# Patient Record
Sex: Female | Born: 1955 | Hispanic: No | Smoking: Never smoker
Health system: Southern US, Community
[De-identification: ages and names within clinical notes are randomized; demographics above are authoritative.]

## PROBLEM LIST (undated history)

## (undated) DIAGNOSIS — I10 Essential (primary) hypertension: Secondary | ICD-10-CM

## (undated) DIAGNOSIS — E785 Hyperlipidemia, unspecified: Secondary | ICD-10-CM

## (undated) DIAGNOSIS — I2510 Atherosclerotic heart disease of native coronary artery without angina pectoris: Secondary | ICD-10-CM

## (undated) HISTORY — DX: Hyperlipidemia, unspecified: E78.5

## (undated) HISTORY — PX: OTHER SURGICAL HISTORY: SHX169

## (undated) HISTORY — DX: Atherosclerotic heart disease of native coronary artery without angina pectoris: I25.10

## (undated) HISTORY — DX: Essential (primary) hypertension: I10

---

## 2009-04-29 ENCOUNTER — Other Ambulatory Visit: Admission: RE | Admit: 2009-04-29 | Discharge: 2009-04-29 | Payer: Self-pay | Admitting: Family Medicine

## 2015-02-07 ENCOUNTER — Other Ambulatory Visit: Payer: Self-pay | Admitting: Gastroenterology

## 2015-02-07 DIAGNOSIS — R634 Abnormal weight loss: Secondary | ICD-10-CM

## 2015-02-07 DIAGNOSIS — R1084 Generalized abdominal pain: Secondary | ICD-10-CM

## 2015-02-12 ENCOUNTER — Other Ambulatory Visit: Payer: Self-pay

## 2015-02-13 ENCOUNTER — Ambulatory Visit
Admission: RE | Admit: 2015-02-13 | Discharge: 2015-02-13 | Disposition: A | Discharge status: Home / Self Care | Payer: Self-pay | Source: Ambulatory Visit | Attending: Gastroenterology | Admitting: Gastroenterology

## 2015-02-13 ENCOUNTER — Other Ambulatory Visit: Payer: Self-pay | Admitting: Gastroenterology

## 2015-02-13 DIAGNOSIS — R1084 Generalized abdominal pain: Secondary | ICD-10-CM

## 2015-02-13 DIAGNOSIS — R109 Unspecified abdominal pain: Secondary | ICD-10-CM

## 2015-02-13 DIAGNOSIS — R634 Abnormal weight loss: Secondary | ICD-10-CM

## 2015-02-13 MED ORDER — IOPAMIDOL (ISOVUE-300) INJECTION 61%
100.0000 mL | Freq: Once | INTRAVENOUS | Status: AC | PRN
  Administered 2015-02-13: 100 mL via INTRAVENOUS

## 2015-02-15 ENCOUNTER — Other Ambulatory Visit: Payer: Self-pay

## 2016-07-06 DIAGNOSIS — Z0000 Encounter for general adult medical examination without abnormal findings: Secondary | ICD-10-CM | POA: Diagnosis not present

## 2016-07-06 DIAGNOSIS — E7800 Pure hypercholesterolemia, unspecified: Secondary | ICD-10-CM | POA: Diagnosis not present

## 2016-07-06 DIAGNOSIS — I10 Essential (primary) hypertension: Secondary | ICD-10-CM | POA: Diagnosis not present

## 2017-01-14 IMAGING — CT CT ABD-PELV W/ CM
2 of 5 series · 16 of 46 positions shown, 18 images · IV contrast (APPLIED)
Comparison: No priors.

CLINICAL DATA: 58-year-old female with 11 pound weight loss and
generalized abdominal pain for the past 3 months. Some intermittent
diarrhea.

EXAM:
CT ABDOMEN AND PELVIS WITH CONTRAST
TECHNIQUE: Multidetector CT imaging of the abdomen and pelvis was performed
using the standard protocol following bolus administration of
intravenous contrast.
CONTRAST:  100mL V9XRDQ-O22 IOPAMIDOL (V9XRDQ-O22) INJECTION 61%

[Series 2: abd pelvis 5.0 i41s 1 · axial · 0.75mm/px · z∈[-478,-68]mm · 13 of 92 slices shown, 15 images]
[im 5/92  soft-tissue]
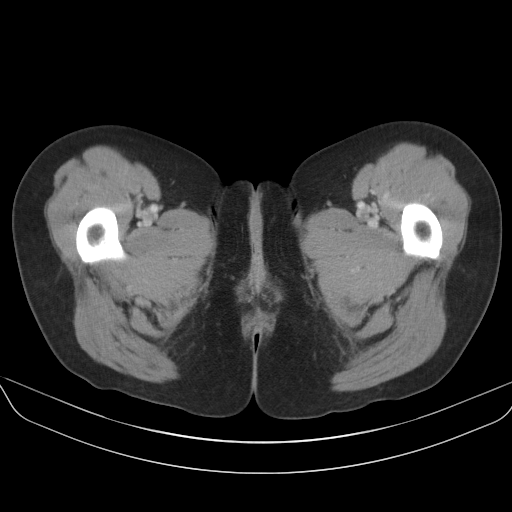
[im 5/92  bone]
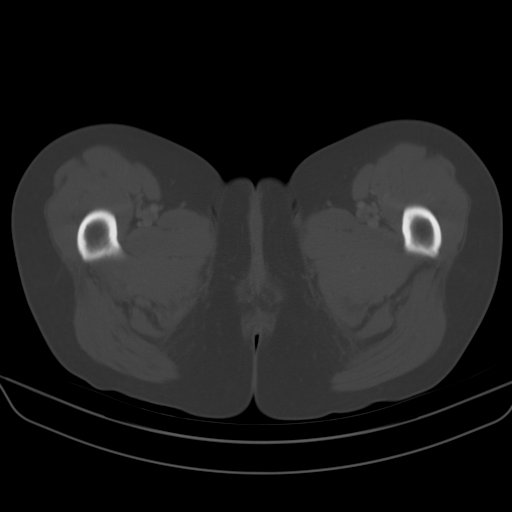
[im 14/92  soft-tissue]
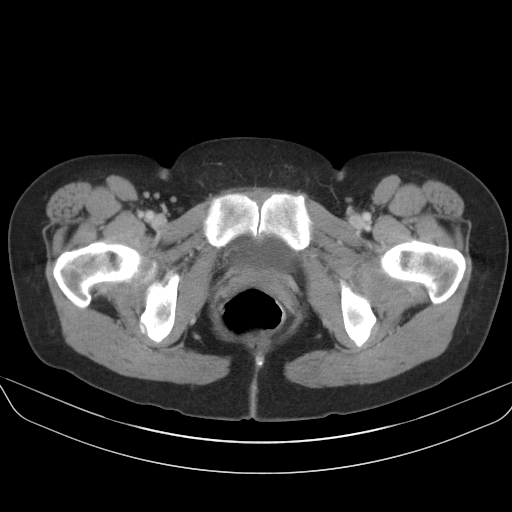
[im 19/92  soft-tissue]
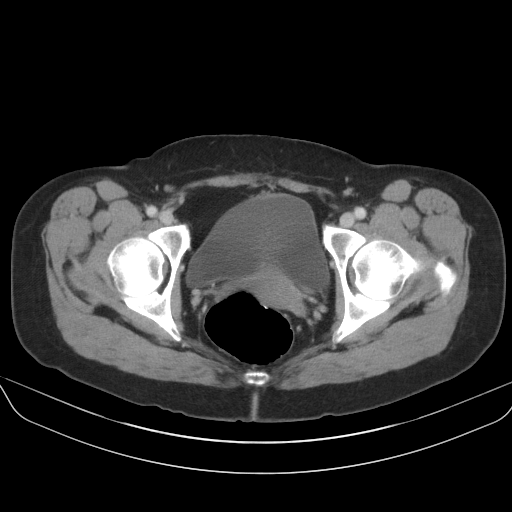
[im 28/92  soft-tissue]
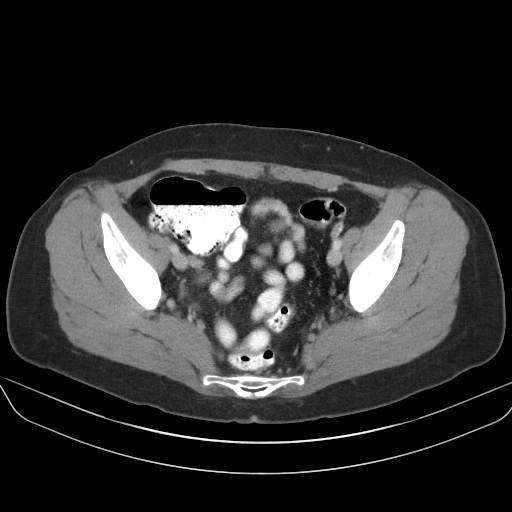
[im 32/92  soft-tissue]
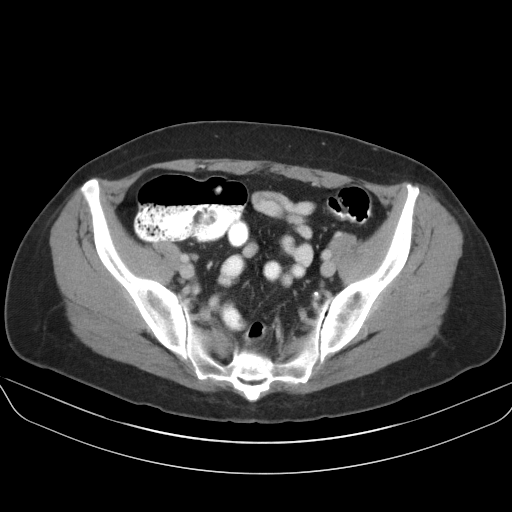
[im 41/92  soft-tissue]
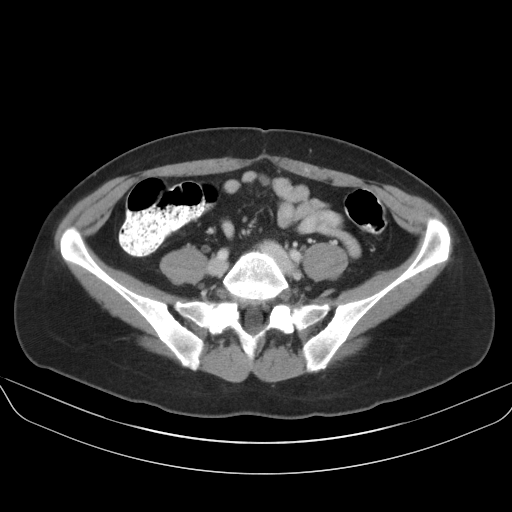
[im 46/92  soft-tissue]
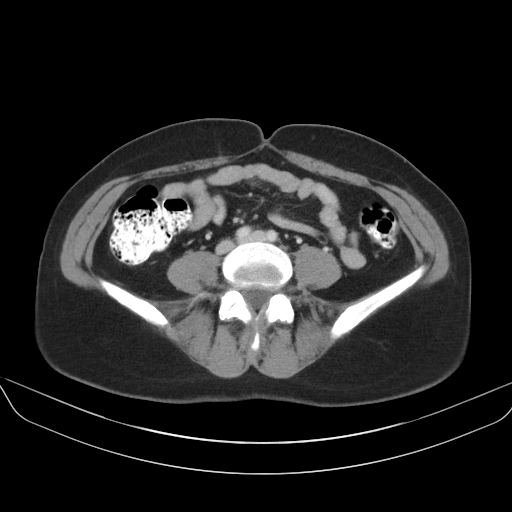
[im 51/92  soft-tissue]
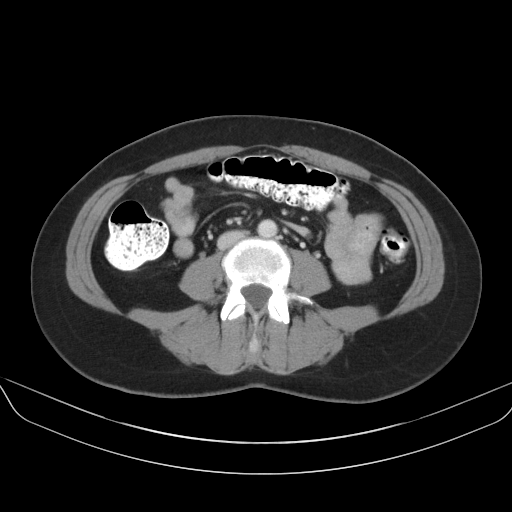
[im 60/92  soft-tissue]
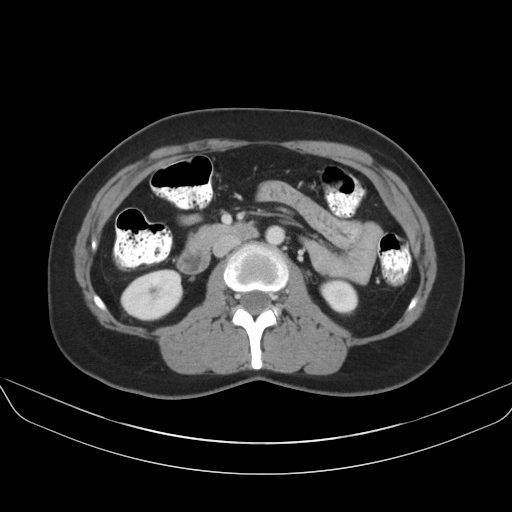
[im 60/92  bone]
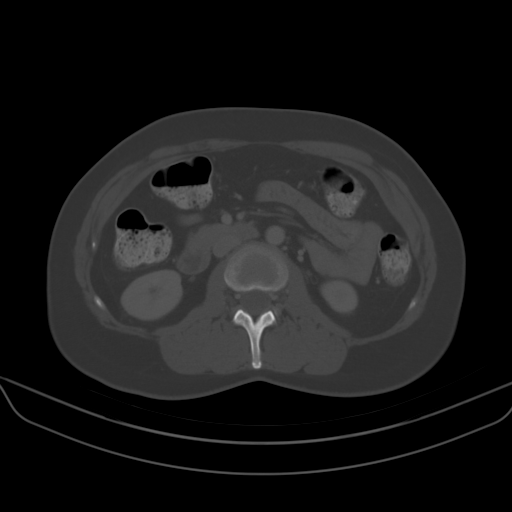
[im 64/92  soft-tissue]
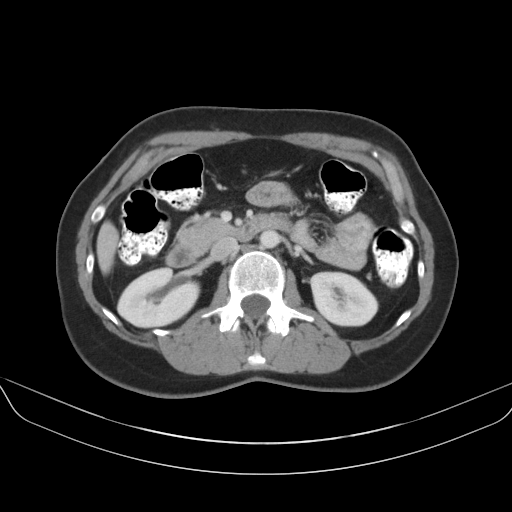
[im 73/92  soft-tissue]
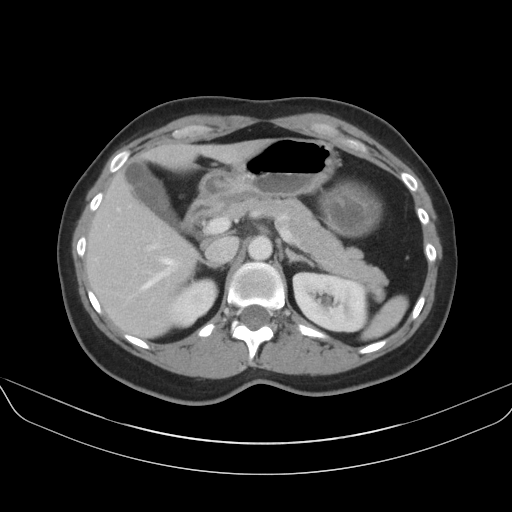
[im 78/92  soft-tissue]
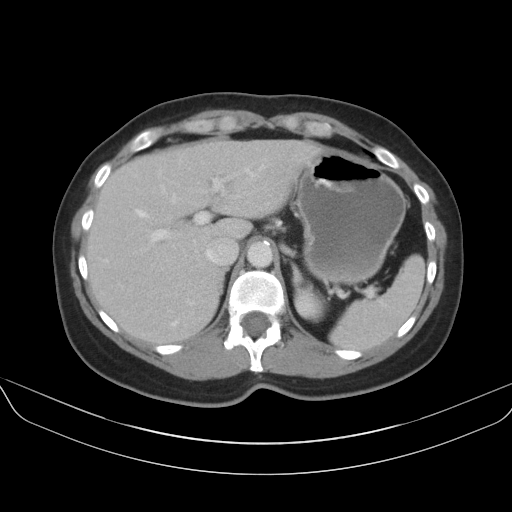
[im 87/92  soft-tissue]
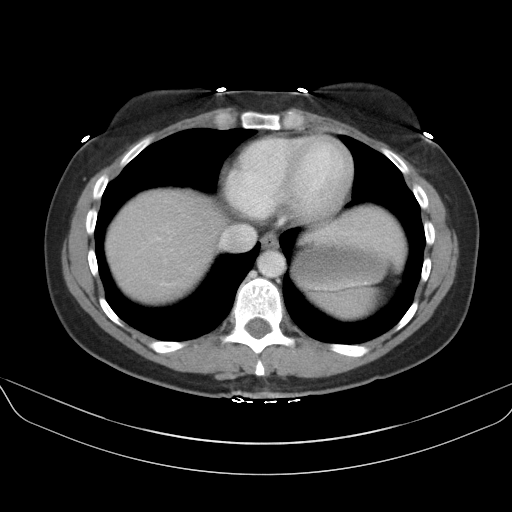

[Series 3: abd pelvis 3.0 spo cor · coronal · 0.75mm/px · 3 of 72 slices shown]
[im 24/72  soft-tissue]
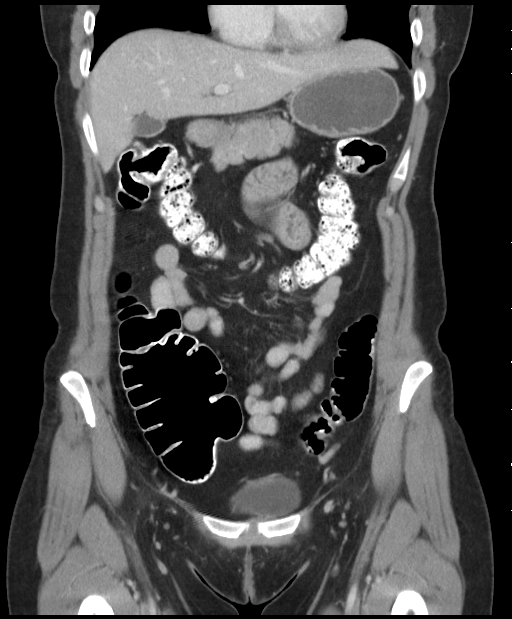
[im 32/72  soft-tissue]
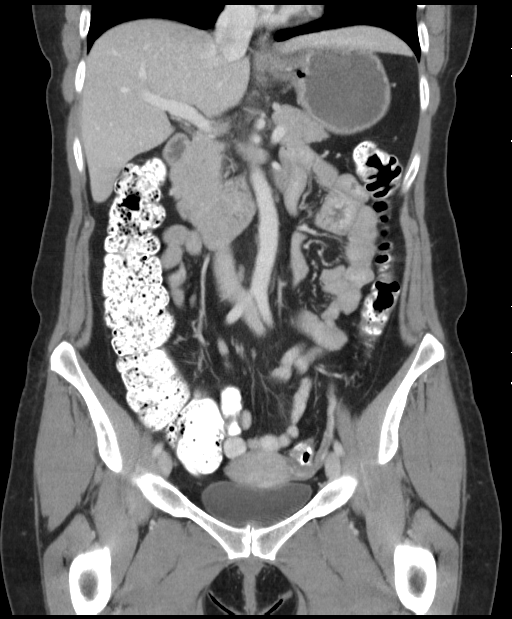
[im 40/72  soft-tissue]
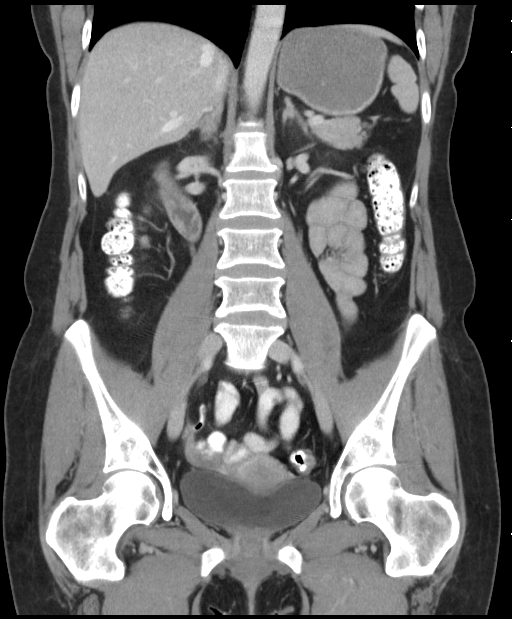

[16 of 46 positions shown; findings below may reference images not displayed]

FINDINGS: Lower chest:  Unremarkable.

Hepatobiliary: Sub cm low-attenuation lesion in segment 4A of the
liver is too small to definitively characterize, but statistically
likely a tiny cyst. No larger more suspicious appearing cystic or
solid hepatic lesions are identified. No intra or extrahepatic
biliary ductal dilatation. Gallbladder is normal in appearance.

Pancreas: No pancreatic mass. No pancreatic ductal dilatation. No
pancreatic or peripancreatic fluid or inflammatory changes.

Spleen: Unremarkable.

Adrenals/Urinary Tract: Bilateral adrenal glands and bilateral
kidneys are normal in appearance. No hydroureteronephrosis. Urinary
bladder is normal in appearance.

Stomach/Bowel: Normal appearance of the stomach. No pathologic
dilatation of small bowel or colon. Normal appendix.

Vascular/Lymphatic: Mild atherosclerosis in the abdominal and pelvic
vasculature, without evidence of aneurysm or dissection. No
lymphadenopathy noted in the abdomen or pelvis.

Reproductive: Uterus and ovaries are unremarkable in appearance.

Other: No significant volume of ascites.  No pneumoperitoneum.

Musculoskeletal: There are no aggressive appearing lytic or blastic
lesions noted in the visualized portions of the skeleton.
IMPRESSION: 1. No acute findings in the abdomen or pelvis to account for the
patient's symptoms.
2. Normal appendix.
3. Additional incidental findings, as above.

## 2017-07-13 DIAGNOSIS — E7800 Pure hypercholesterolemia, unspecified: Secondary | ICD-10-CM | POA: Diagnosis not present

## 2017-07-13 DIAGNOSIS — Z0000 Encounter for general adult medical examination without abnormal findings: Secondary | ICD-10-CM | POA: Diagnosis not present

## 2017-07-13 DIAGNOSIS — I10 Essential (primary) hypertension: Secondary | ICD-10-CM | POA: Diagnosis not present

## 2017-07-13 DIAGNOSIS — G4700 Insomnia, unspecified: Secondary | ICD-10-CM | POA: Diagnosis not present

## 2017-08-09 DIAGNOSIS — Z1211 Encounter for screening for malignant neoplasm of colon: Secondary | ICD-10-CM | POA: Diagnosis not present

## 2017-08-26 DIAGNOSIS — Z1211 Encounter for screening for malignant neoplasm of colon: Secondary | ICD-10-CM | POA: Diagnosis not present

## 2018-03-17 DIAGNOSIS — Z01419 Encounter for gynecological examination (general) (routine) without abnormal findings: Secondary | ICD-10-CM | POA: Diagnosis not present

## 2018-03-17 DIAGNOSIS — Z1231 Encounter for screening mammogram for malignant neoplasm of breast: Secondary | ICD-10-CM | POA: Diagnosis not present

## 2018-03-17 DIAGNOSIS — Z6827 Body mass index (BMI) 27.0-27.9, adult: Secondary | ICD-10-CM | POA: Diagnosis not present

## 2018-03-17 DIAGNOSIS — Z124 Encounter for screening for malignant neoplasm of cervix: Secondary | ICD-10-CM | POA: Diagnosis not present

## 2018-08-02 DIAGNOSIS — Z0000 Encounter for general adult medical examination without abnormal findings: Secondary | ICD-10-CM | POA: Diagnosis not present

## 2018-08-02 DIAGNOSIS — Z1159 Encounter for screening for other viral diseases: Secondary | ICD-10-CM | POA: Diagnosis not present

## 2018-08-02 DIAGNOSIS — I10 Essential (primary) hypertension: Secondary | ICD-10-CM | POA: Diagnosis not present

## 2019-04-11 DIAGNOSIS — R002 Palpitations: Secondary | ICD-10-CM | POA: Diagnosis not present

## 2019-04-11 DIAGNOSIS — F411 Generalized anxiety disorder: Secondary | ICD-10-CM | POA: Diagnosis not present

## 2019-08-08 DIAGNOSIS — I10 Essential (primary) hypertension: Secondary | ICD-10-CM | POA: Diagnosis not present

## 2019-08-08 DIAGNOSIS — Z0000 Encounter for general adult medical examination without abnormal findings: Secondary | ICD-10-CM | POA: Diagnosis not present

## 2019-08-08 DIAGNOSIS — E7800 Pure hypercholesterolemia, unspecified: Secondary | ICD-10-CM | POA: Diagnosis not present

## 2019-08-08 DIAGNOSIS — R002 Palpitations: Secondary | ICD-10-CM | POA: Diagnosis not present

## 2019-08-22 ENCOUNTER — Ambulatory Visit: Payer: BC Managed Care – PPO

## 2019-08-22 ENCOUNTER — Encounter: Payer: Self-pay | Admitting: Cardiology

## 2019-08-22 ENCOUNTER — Other Ambulatory Visit: Payer: Self-pay

## 2019-08-22 ENCOUNTER — Ambulatory Visit: Payer: BC Managed Care – PPO | Admitting: Cardiology

## 2019-08-22 VITALS — BP 140/86 | HR 82 | Ht 63.0 in | Wt 132.3 lb

## 2019-08-22 DIAGNOSIS — R072 Precordial pain: Secondary | ICD-10-CM | POA: Diagnosis not present

## 2019-08-22 DIAGNOSIS — I10 Essential (primary) hypertension: Secondary | ICD-10-CM

## 2019-08-22 DIAGNOSIS — R002 Palpitations: Secondary | ICD-10-CM

## 2019-08-22 DIAGNOSIS — R0600 Dyspnea, unspecified: Secondary | ICD-10-CM | POA: Diagnosis not present

## 2019-08-22 DIAGNOSIS — R0609 Other forms of dyspnea: Secondary | ICD-10-CM

## 2019-08-22 MED ORDER — AMLODIPINE BESYLATE 5 MG PO TABS: 5.0000 mg | ORAL_TABLET | Freq: Every evening | ORAL | 3 refills | Status: DC

## 2019-08-22 NOTE — Patient Instructions (Addendum)
Please remember to bring in your medication bottles in at the next visit.   New Medications that were added at today's visit:  Norvasc 5 mg p.o. q. evening  Medications that were discontinued at today's visit: None  Office will call you to have the following tests scheduled:  Echo  CT calcium score Monitor  Please get blood work done on September 19, 2019 at the nearest [facility] prior to the next office visit.  Recommend follow up with your PCP as scheduled.

## 2019-08-22 NOTE — Progress Notes (Signed)
REASON FOR CONSULT: Palpitations, shortness of breath, family history of cardiac disease.  Chief Complaint  Patient presents with  . Palpitations   REQUESTING PHYSICIAN:  Rankins, Bill Salinas, MD [address],  [place] [address]  HPI  Jane Cuevas is a 64 y.o. female who presents to the office with a chief complaint of " palpitations and shortness of breath." Patient's past medical history and cardiac risk factors include: Hypertension, hyperlipidemia, postmenopausal female.  Palpitations: Patient states that she has been experiencing palpitation for approximately 1 year.  They occur every night and the symptoms are better in the morning.  Symptoms can occur while she is at rest or with effort related activities.  Over the last 2 or 3 weeks the frequency of her palpitations have decreased compared to before.  She states that in the past that she was using valerian supplements to see if the symptoms would improve and was also taking meldonium which is considered to be a performance enhancing medication.  Both of these supplements has been getting from either friends or sisters from [place].  No improving or worsening factors, the symptoms usually last for about 15 minutes in duration.  Patient denies any associated symptoms of near syncope or syncope.  Chest heaviness: When going for the review of systems patient notes that she is also been having chest heaviness at times.  They occur about 3 times a week.  More common in the evening hours.  The symptoms last for less than 60 minutes and the intensity is 5 out of 10.  The heaviness is located over the left breast area and it is not associated with effort related activities noted that improved with rest.  Patient states that it is usually self-limited.  No recent cardiovascular work-up.  Patient does have elevated cholesterol for which she is currently not on medications because she would like to try lifestyle modifications.   Currently she denies any active chest pain during the office visit.  Benign essential hypertension: Blood pressure at today's office visit is not at goal.  Patient states that when she checks her blood pressure at home it ranges between 917-915 mmHg and diastolic blood pressures range between 70-75 mmHg.  Patient states that she tries to consume a low-salt diet.  And she was never been told that the numbers that she is getting at home are uncontrolled.  Review of systems positive for: Palpitations, chest heaviness (as described above), occasional effort related dyspnea. Currently patient denies lightheadedness, dizziness, orthopnea, paroxysmal nocturnal dyspnea, lower extremity swelling, near syncope, syncopal events, hematochezia, hemoptysis, hematemesis, melanotic stools, no symptoms of amaurosis fugax, motor or sensory symptoms or dysphasia in the last 6 months.   Denies prior history of coronary artery disease, myocardial infarction, congestive heart failure, deep venous thrombosis, pulmonary embolism, stroke, transient ischemic attack.  ALLERGIES: No Known Allergies   MEDICATION LIST PRIOR TO VISIT: Current Outpatient Medications on File Prior to Visit  Medication Sig Dispense Refill  . valsartan (DIOVAN) 160 MG tablet Take 160 mg by mouth daily.    . metoprolol succinate (TOPROL-XL) 50 MG 24 hr tablet Take 50 mg by mouth daily.     No current facility-administered medications on file prior to visit.    PAST MEDICAL HISTORY: Past Medical History:  Diagnosis Date  . Hyperlipidemia   . Hypertension     PAST SURGICAL HISTORY: Past Surgical History:  Procedure Laterality Date  . cesarean     x2    FAMILY HISTORY: The  patient family history includes Hypertension in her brother, sister, and sister.   SOCIAL HISTORY:  The patient  reports that she has never smoked. She has never used smokeless tobacco. She reports previous alcohol use. She reports that she does not use  drugs.  14 ORGAN REVIEW OF SYSTEMS: CONSTITUTIONAL: No fever or significant weight loss EYES: No recent significant visual change EARS, NOSE, MOUTH, THROAT: No recent significant change in hearing CARDIOVASCULAR: See discussion in subjective/HPI RESPIRATORY: See discussion in subjective/HPI GASTROINTESTINAL: No recent complaints of abdominal pain GENITOURINARY: No recent significant change in genitourinary status MUSCULOSKELETAL: No recent significant change in musculoskeletal status INTEGUMENTARY: No recent rash NEUROLOGIC: No recent significant change in motor function PSYCHIATRIC: No recent significant change in mood ENDOCRINOLOGIC: No recent significant change in endocrine status HEMATOLOGIC/LYMPHATIC: No recent significant unexpected bruising ALLERGIC/IMMUNOLOGIC: No recent unexplained allergic reaction  PHYSICAL EXAM: Vitals with BMI 08/22/2019  Height 5\' 3"   Weight 132 lbs 4 oz  BMI 23.44  Systolic 140  Diastolic 86  Pulse 82   CONSTITUTIONAL: Well-developed and well-nourished. No acute distress.  SKIN: Skin is warm and dry. No rash noted. No cyanosis. No pallor. No jaundice HEAD: Normocephalic and atraumatic.  EYES: No scleral icterus MOUTH/THROAT: Moist oral membranes.  NECK: No JVD present. No thyromegaly noted. No carotid bruits  LYMPHATIC: No visible cervical adenopathy.  CHEST Normal respiratory effort. No intercostal retractions  LUNGS: Clear to auscultation bilaterally.  No stridor. No wheezes. No rales.  CARDIOVASCULAR: Regular rate and rhythm, positive S1-S2, no murmurs rubs or gallops appreciated. ABDOMINAL: Soft, nontender, nondistended, positive bowel sounds in all 4 quadrants.  No apparent ascites.  EXTREMITIES: No peripheral edema  HEMATOLOGIC: No significant bruising NEUROLOGIC: Oriented to person, place, and time. Nonfocal. Normal muscle tone.  PSYCHIATRIC: Normal mood and affect. Normal behavior. Cooperative  CARDIAC DATABASE: EKG: 08/22/2019: Normal  sinus rhythm ventricular rate of 76 bpm, normal axis deviation, left atrial enlargement, no underlying ischemia or injury pattern.   Echocardiogram: None  Stress Testing:  None  Heart Catheterization: None  LABORATORY DATA: External Labs: Collected: August 08, 2019 Creatinine 0.78 mg/dL. Lipid profile: Total cholesterol 159, triglycerides 129, HDL 51, LDL 85 TSH: 4.47  FINAL MEDICATION LIST END OF ENCOUNTER: Meds ordered this encounter  Medications  . amLODipine (NORVASC) 5 MG tablet    Sig: Take 1 tablet (5 mg total) by mouth every evening.    Dispense:  180 tablet    Refill:  3    Medications Discontinued During This Encounter  Medication Reason  . ALPRAZolam (XANAX) 0.25 MG tablet Patient Discharge     Current Outpatient Medications:  .  valsartan (DIOVAN) 160 MG tablet, Take 160 mg by mouth daily., Disp: , Rfl:  .  amLODipine (NORVASC) 5 MG tablet, Take 1 tablet (5 mg total) by mouth every evening., Disp: 180 tablet, Rfl: 3 .  metoprolol succinate (TOPROL-XL) 50 MG 24 hr tablet, Take 50 mg by mouth daily., Disp: , Rfl:   IMPRESSION:    ICD-10-CM   1. Palpitations  R00.2 EKG 12-Lead    LONG TERM MONITOR-LIVE TELEMETRY (3-14 DAYS)    PCV ECHOCARDIOGRAM COMPLETE    CT CARDIAC SCORING  2. Precordial chest pain  R07.2 Lipid Panel With LDL/HDL Ratio  3. Dyspnea on exertion  R06.00   4. Benign hypertension  I10 PCV ECHOCARDIOGRAM COMPLETE    amLODipine (NORVASC) 5 MG tablet     RECOMMENDATIONS: Jane Cuevas is a 64 y.o. female whose past medical history and cardiac risk factors  include: Hypertension, hyperlipidemia.  Palpitations:  Patient is instructed not to take medications that have not been prescribed to her.  Check 14-day mobile cardiac ambulatory telemetry to evaluate for underlying arrhythmic burden.  Plan echocardiogram to evaluate for structural heart disease and LV function.  EKG reviewed and interpreted and noted above.  TSH levels  reviewed.  Patient is requested to stop taking valerian and Meldonium which he gets from [place].  Precordial chest pain and effort related dyspnea:  Given the patient's symptoms of chest heaviness and multiple cardiovascular risk factors would recommend additional testing for risk ratification.  We discussed undergoing coronary calcification score to evaluate for coronary artery disease.  If patient has significant calcium burden and/or abnormal echocardiogram we will proceed with either stress test or cardiac CTA at the next office visit.  Patient is in agreement with the current plan of care.  If patient has calcium burden and no significant change in lipid profile after lifestyle modifications would recommend statin therapy in addition to lifestyle modifications.   Check lipid profile prior to next office visit  Patient's estimated 10-year risk of ASCVD is greater than 5%.  Benign essential hypertension: Currently not at goal.  Patient has not taken her morning blood pressure medications prior to coming to the office today.  Patient is asked to keep a log of her blood pressures so that her medications can further be uptitrated based on a trend instead of isolated blood pressure readings.  Currently her home blood pressures are not at goal as discussed above.  We will start Norvasc 5 mg p.o.Q. evening. . If the blood pressure is consistently greater than patient is asked to call the office to for medication titration sooner than the next office visit.  . Low salt diet recommended. A diet that is rich in fruits, vegetables, legumes, and low-fat dairy products and low in snacks, sweets, and meats (such as the Dietary Approaches to Stop Hypertension [DASH] diet).   Orders Placed This Encounter  Procedures  . CT CARDIAC SCORING  . Lipid Panel With LDL/HDL Ratio  . LONG TERM MONITOR-LIVE TELEMETRY (3-14 DAYS)  . EKG 12-Lead  . PCV ECHOCARDIOGRAM COMPLETE   --Continue  cardiac medications as reconciled in final medication list. --Return in about 1 month (around 09/22/2019) for Discussion of test results. Or sooner if needed. --Continue follow-up with your primary care physician regarding the management of your other chronic comorbid conditions.  Patient's questions and concerns were addressed to her satisfaction. She voices understanding of the instructions provided during this encounter.   This note was created using a voice recognition software as a result there may be grammatical errors inadvertently enclosed that do not reflect the nature of this encounter. Every attempt is made to correct such errors.  Tessa Lerner, DO, [facility] Office: [phone]

## 2019-09-04 ENCOUNTER — Other Ambulatory Visit: Payer: Self-pay | Admitting: Cardiology

## 2019-09-04 DIAGNOSIS — Z8249 Family history of ischemic heart disease and other diseases of the circulatory system: Secondary | ICD-10-CM | POA: Diagnosis not present

## 2019-09-04 DIAGNOSIS — R072 Precordial pain: Secondary | ICD-10-CM | POA: Diagnosis not present

## 2019-09-04 DIAGNOSIS — E785 Hyperlipidemia, unspecified: Secondary | ICD-10-CM | POA: Diagnosis not present

## 2019-09-04 DIAGNOSIS — Z136 Encounter for screening for cardiovascular disorders: Secondary | ICD-10-CM | POA: Diagnosis not present

## 2019-09-05 LAB — LIPID PANEL WITH LDL/HDL RATIO
Cholesterol, Total: 279 mg/dL — ABNORMAL HIGH (ref 100–199)
HDL: 64 mg/dL (ref >39)
LDL Chol Calc (NIH): 192 mg/dL — ABNORMAL HIGH (ref 0–99)
LDL/HDL Ratio: 3 ratio (ref 0.0–3.2)
Triglycerides: 130 mg/dL (ref 0–149)
VLDL Cholesterol Cal: 23 mg/dL (ref 5–40)

## 2019-09-06 ENCOUNTER — Telehealth: Payer: Self-pay | Admitting: Cardiology

## 2019-09-06 NOTE — Telephone Encounter (Signed)
Spoke that patient at [phone] (M). Went over the results.  She is aware of the 13mm nodule that was reported on the CT and it needs follow-up. She will follow up with her primary provider regarding this as well.  If you can fax a copy / inform her PCP office would be great as it closes the communication loop.   ST

## 2019-09-07 ENCOUNTER — Ambulatory Visit: Payer: BC Managed Care – PPO

## 2019-09-07 ENCOUNTER — Other Ambulatory Visit: Payer: Self-pay

## 2019-09-07 DIAGNOSIS — R002 Palpitations: Secondary | ICD-10-CM | POA: Diagnosis not present

## 2019-09-07 DIAGNOSIS — I10 Essential (primary) hypertension: Secondary | ICD-10-CM | POA: Diagnosis not present

## 2019-09-11 ENCOUNTER — Telehealth: Payer: Self-pay

## 2019-09-11 NOTE — Telephone Encounter (Signed)
-----   Message from [place], [place] sent at 09/09/2019  7:23 PM EDT ----- Please inform the patient that her total calcium score is 23.  We will discuss the results at the next office visit.  Please make sure she follows up.

## 2019-09-11 NOTE — Telephone Encounter (Signed)
Left vm to cb.

## 2019-09-12 NOTE — Telephone Encounter (Signed)
Second attempt to call, left another voicemail.

## 2019-09-16 ENCOUNTER — Other Ambulatory Visit: Payer: Self-pay | Admitting: Cardiology

## 2019-09-16 DIAGNOSIS — R0609 Other forms of dyspnea: Secondary | ICD-10-CM

## 2019-09-16 DIAGNOSIS — R072 Precordial pain: Secondary | ICD-10-CM

## 2019-09-16 NOTE — Progress Notes (Signed)
cmp

## 2019-09-18 ENCOUNTER — Telehealth: Payer: Self-pay

## 2019-09-18 NOTE — Telephone Encounter (Signed)
Relayed information to patient

## 2019-09-18 NOTE — Telephone Encounter (Signed)
-----   Message from Sunit Tolia, DO sent at 09/16/2019  2:35 PM EDT ----- The left ventricular ejection fraction or pumping activity of the heart is within the normal limit. Details can be reviewed at the next office visit.  

## 2019-09-18 NOTE — Telephone Encounter (Signed)
-----   Message from [place], [place] sent at 09/16/2019  2:35 PM EDT ----- The left ventricular ejection fraction or pumping activity of the heart is within the normal limit. Details can be reviewed at the next office visit.

## 2019-09-19 DIAGNOSIS — R0600 Dyspnea, unspecified: Secondary | ICD-10-CM | POA: Diagnosis not present

## 2019-09-19 DIAGNOSIS — R072 Precordial pain: Secondary | ICD-10-CM | POA: Diagnosis not present

## 2019-09-20 LAB — COMPREHENSIVE METABOLIC PANEL
ALT: 26 IU/L (ref 0–32)
AST: 17 IU/L (ref 0–40)
Albumin/Globulin Ratio: 2 (ref 1.2–2.2)
Albumin: 4.8 g/dL (ref 3.8–4.8)
Alkaline Phosphatase: 100 IU/L (ref 39–117)
BUN/Creatinine Ratio: 13 (ref 12–28)
BUN: 11 mg/dL (ref 8–27)
Bilirubin Total: 0.4 mg/dL (ref 0.0–1.2)
CO2: 23 mmol/L (ref 20–29)
Calcium: 10.1 mg/dL (ref 8.7–10.3)
Chloride: 105 mmol/L (ref 96–106)
Creatinine, Ser: 0.84 mg/dL (ref 0.57–1.00)
GFR calc Af Amer: 86 mL/min/{1.73_m2} (ref >59)
GFR calc non Af Amer: 75 mL/min/{1.73_m2} (ref >59)
Globulin, Total: 2.4 g/dL (ref 1.5–4.5)
Glucose: 95 mg/dL (ref 65–99)
Potassium: 5.5 mmol/L — ABNORMAL HIGH (ref 3.5–5.2)
Sodium: 143 mmol/L (ref 134–144)
Total Protein: 7.2 g/dL (ref 6.0–8.5)

## 2019-09-20 LAB — LIPID PANEL WITH LDL/HDL RATIO
Cholesterol, Total: 193 mg/dL (ref 100–199)
HDL: 53 mg/dL (ref >39)
LDL Chol Calc (NIH): 115 mg/dL — ABNORMAL HIGH (ref 0–99)
LDL/HDL Ratio: 2.2 ratio (ref 0.0–3.2)
Triglycerides: 141 mg/dL (ref 0–149)
VLDL Cholesterol Cal: 25 mg/dL (ref 5–40)

## 2019-09-20 LAB — HEMOGLOBIN A1C
Est. average glucose Bld gHb Est-mCnc: 117 mg/dL
Hgb A1c MFr Bld: 5.7 % — ABNORMAL HIGH (ref 4.8–5.6)

## 2019-09-22 ENCOUNTER — Other Ambulatory Visit: Payer: Self-pay

## 2019-09-22 ENCOUNTER — Ambulatory Visit: Payer: BC Managed Care – PPO | Admitting: Cardiology

## 2019-09-22 ENCOUNTER — Encounter: Payer: Self-pay | Admitting: Cardiology

## 2019-09-22 VITALS — BP 142/86 | HR 77 | Temp 97.4°F | Resp 16 | Ht 64.0 in | Wt 160.0 lb

## 2019-09-22 DIAGNOSIS — I10 Essential (primary) hypertension: Secondary | ICD-10-CM | POA: Diagnosis not present

## 2019-09-22 DIAGNOSIS — Z712 Person consulting for explanation of examination or test findings: Secondary | ICD-10-CM

## 2019-09-22 DIAGNOSIS — R0609 Other forms of dyspnea: Secondary | ICD-10-CM | POA: Diagnosis not present

## 2019-09-22 DIAGNOSIS — R002 Palpitations: Secondary | ICD-10-CM | POA: Diagnosis not present

## 2019-09-22 DIAGNOSIS — R072 Precordial pain: Secondary | ICD-10-CM | POA: Diagnosis not present

## 2019-09-22 DIAGNOSIS — I2510 Atherosclerotic heart disease of native coronary artery without angina pectoris: Secondary | ICD-10-CM

## 2019-09-22 NOTE — Progress Notes (Signed)
Jane Cuevas Date of Birth: [birth year] MRN: [id number] Primary Care Provider:Rankins, Jane Salinas, MD  Date: 09/22/19 Last Office Visit: 08/22/2019  Chief Complaint  Patient presents with   Results   Follow-up    1 mo    HPI  Jane Cuevas is a 64 y.o. female who presents to the office with a chief complaint of " follow-up on test results and shortness of breath with exertion" Patient's past medical history and cardiac risk factors include: Hypertension, hyperlipidemia, coronary artery calcification, postmenopausal female.  Patient was seen in consultation March 2021 in consultation for evaluation of palpitations, shortness of breath and family history of cardiac disease.    Since last office visit patient's palpitations have improved significantly.  She continues to anabolic metoprolol on a daily basis.  She underwent mobile cardiac ambulatory telemetry which was terminated after approximately 7 days due to allergic reaction from the stickers.  Mobile cardiac ambulatory telemetry results illustrated that she was in normal sinus rhythm and no evidence of dysrhythmias, details noted below for further reference.  At the last office visit patient was complaining of chest heaviness.  Given her cardiovascular risk factors she was recommended to undergo coronary artery calcification score.  Her coronary artery calcification score was 23 AU of which 22 is in the left main.  She continues to have effort related dyspnea on a daily basis.  She states that when she goes up a few flights of stairs she is very short winded.  She states her symptoms are chronic and stable but are not resolving.  Patient was asked to keep a blood pressure log since last office visit.  She brings it in for review.  Her morning blood pressures range between 315-176 mmHg and diastolic blood pressures range between 80-88 mmHg and pulse is greater than 60 bpm.  Her systolic blood pressures in the evening range from  160-737 mmHg and diastolic blood pressures are between 80-91 mmHg with a pulse greater than 60 bpm.  Patient states that since last office visit she stopped taking valsartan as she was introduced to amlodipine.  Patient's blood pressures have remained not well controlled secondary to stopping valsartan.  Patient carries a diagnosis of hyperlipidemia.  She states that she takes Lipitor on as-needed basis.  When asked how frequently she takes it patient states that she may take it once a week or once or twice a month.  Patient's cholesterol levels are not currently at goal. Lipid profile: Collected: 08/08/2019 Total cholesterol 159, triglycerides 128, HDL 51, LDL 85, non-HDL 108. Collected: 09/04/2019: Total cholesterol 279, triglycerides 130, HDL 64, LDL 189, non-HDL 192 Collected 09/29/2019: Total cholesterol 193, triglycerides 141, HDL 53, LDL 111, non-HDL 115  History of coronary artery calcification. Denies prior history of myocardial infarction, congestive heart failure, deep venous thrombosis, pulmonary embolism, stroke, transient ischemic attack.  ALLERGIES: No Known Allergies   MEDICATION LIST PRIOR TO VISIT: Current Outpatient Medications on File Prior to Visit  Medication Sig Dispense Refill   amLODipine (NORVASC) 5 MG tablet Take 1 tablet (5 mg total) by mouth every evening. 180 tablet 3   atorvastatin (LIPITOR) 20 MG tablet Take 20 mg by mouth at bedtime.     metoprolol succinate (TOPROL-XL) 50 MG 24 hr tablet Take 50 mg by mouth daily.     valsartan (DIOVAN) 160 MG tablet Take 160 mg by mouth daily.     No current facility-administered medications on file prior to visit.    PAST MEDICAL HISTORY: Past Medical History:  Diagnosis Date   Hyperlipidemia    Hypertension     PAST SURGICAL HISTORY: Past Surgical History:  Procedure Laterality Date   cesarean     x2    FAMILY HISTORY: The patient family history includes Hypertension in her brother, sister, and  sister.   SOCIAL HISTORY:  The patient  reports that she has never smoked. She has never used smokeless tobacco. She reports previous alcohol use. She reports that she does not use drugs.  14 ORGAN REVIEW OF SYSTEMS: CONSTITUTIONAL: No fever or significant weight loss EYES: No recent significant visual change EARS, NOSE, MOUTH, THROAT: No recent significant change in hearing CARDIOVASCULAR: See discussion in subjective/HPI RESPIRATORY: See discussion in subjective/HPI GASTROINTESTINAL: No recent complaints of abdominal pain GENITOURINARY: No recent significant change in genitourinary status MUSCULOSKELETAL: No recent significant change in musculoskeletal status INTEGUMENTARY: No recent rash NEUROLOGIC: No recent significant change in motor function PSYCHIATRIC: No recent significant change in mood ENDOCRINOLOGIC: No recent significant change in endocrine status HEMATOLOGIC/LYMPHATIC: No recent significant unexpected bruising ALLERGIC/IMMUNOLOGIC: No recent unexplained allergic reaction  PHYSICAL EXAM: Vitals with BMI 09/22/2019 09/22/2019 08/22/2019  Height - 5\' 4"  5\' 3"   Weight - 160 lbs 132 lbs 4 oz  BMI - 27.45 23.44  Systolic 142 155  Diastolic 86 87 86  Pulse - 77 82   CONSTITUTIONAL: Well-developed and well-nourished. No acute distress.  SKIN: Skin is warm and dry. No rash noted. No cyanosis. No pallor. No jaundice HEAD: Normocephalic and atraumatic.  EYES: No scleral icterus MOUTH/THROAT: Moist oral membranes.  NECK: No JVD present. No thyromegaly noted. No carotid bruits  LYMPHATIC: No visible cervical adenopathy.  CHEST Normal respiratory effort. No intercostal retractions  LUNGS: Clear to auscultation bilaterally.  No stridor. No wheezes. No rales.  CARDIOVASCULAR: Regular rate and rhythm, positive S1-S2, no murmurs rubs or gallops appreciated. ABDOMINAL: Soft, nontender, nondistended, positive bowel sounds in all 4 quadrants.  No apparent ascites.  EXTREMITIES: No  peripheral edema  HEMATOLOGIC: No significant bruising NEUROLOGIC: Oriented to person, place, and time. Nonfocal. Normal muscle tone.  PSYCHIATRIC: Normal mood and affect. Normal behavior. Cooperative  CARDIAC DATABASE: EKG: 08/22/2019: Normal sinus rhythm ventricular rate of 76 bpm, normal axis deviation, left atrial enlargement, no underlying ischemia or injury pattern.   Echocardiogram: 09/07/2019: Normal LV systolic function with EF 67%. Left ventricle cavity is normal in size. Normal left ventricular wall thickness. Normal global wall motion. Normal diastolic filling pattern. Calculated EF 67%. Structurally normal tricuspid valve.  Mild tricuspid regurgitation. No evidence of pulmonary hypertension.  Stress Testing:  None  Heart Catheterization: None  Coronary artery calcification scoring performed on 09/04/2019 at [facility]: Left main: 22 Left anterior descending: 0 Left circumflex: 1 Right coronary artery: 0 Total calcium score: 23 AU, which is between 50th and 75th percentile for female patient this age.  LABORATORY DATA: External Labs: Collected: August 08, 2019 Creatinine 0.78 mg/dL. Lipid profile: Total cholesterol 159, triglycerides 129, HDL 51, LDL 85 TSH: 4.47  FINAL MEDICATION LIST END OF ENCOUNTER: No orders of the defined types were placed in this encounter.   There are no discontinued medications.   Current Outpatient Medications:    amLODipine (NORVASC) 5 MG tablet, Take 1 tablet (5 mg total) by mouth every evening., Disp: 180 tablet, Rfl: 3   atorvastatin (LIPITOR) 20 MG tablet, Take 20 mg by mouth at bedtime., Disp: , Rfl:    metoprolol succinate (TOPROL-XL) 50 MG 24 hr tablet, Take 50 mg by mouth daily., Disp: ,  Rfl:    valsartan (DIOVAN) 160 MG tablet, Take 160 mg by mouth daily., Disp: , Rfl:   IMPRESSION:    ICD-10-CM   1. Dyspnea on exertion  R06.00 PCV MYOCARDIAL PERFUSION WITH LEXISCAN  2. Palpitations  R00.2   3. Benign hypertension   I10 PCV MYOCARDIAL PERFUSION WITH LEXISCAN  4. Precordial chest pain  R07.2 PCV MYOCARDIAL PERFUSION WITH LEXISCAN  5. Coronary artery calcification seen on computed tomography  I25.10 PCV MYOCARDIAL PERFUSION WITH LEXISCAN  6. Encounter to discuss test results  Z71.2      RECOMMENDATIONS: Jane Cuevas is a 64 y.o. female whose past medical history and cardiac risk factors include: Hypertension, hyperlipidemia, coronary artery calcification, postmenopausal female.  Dyspnea on exertion:  Patient left related dyspnea may be secondary to uncontrolled hypertension and/or coronary artery disease.  Patient is asked to restart her valsartan in the morning and to keep a blood pressure log and to bring it in at the next office.    In addition, patient's coronary artery calcification score is 23 AU; however, majority of it is present in the left main.  Recommend nuclear stress test to rule out reversible ischemia.  Patient is educated on importance of lifestyle modification.  Patient takes Lipitor as needed basis once or twice a month or prior to upcoming physician appointments.  She is asked to take it every night regularly and to have the lipid panel rechecked in 6 weeks.  Echocardiogram results reviewed with the patient.  Palpitations: Stable.  Mobile telemetry reviewed with the patient.  Findings conveyed to the patient.  Continue beta-blocker therapy.  Benign essential hypertension:   Blood pressure is currently not at goal.  Patient is asked to restart valsartan.    Continue amlodipine in the evening.    Patient is asked to keep a log of her blood pressures so that her medications can further be uptitrated based on a trend instead of isolated blood pressure readings.  If the blood pressure is consistently greater than patient is asked to call the office to for medication titration sooner than the next office visit.   Low salt diet recommended. A diet that is rich  in fruits, vegetables, legumes, and low-fat dairy products and low in snacks, sweets, and meats (such as the Dietary Approaches to Stop Hypertension [DASH] diet).   Mixed hyperlipidemia:  Patient is encouraged to take statin therapy on a regular basis.  Follow lipids.  Most recent lipid profile reviewed with the patient.  Orders Placed This Encounter  Procedures   PCV MYOCARDIAL PERFUSION WITH LEXISCAN   --Continue cardiac medications as reconciled in final medication list. --Return in about 4 weeks (around 10/20/2019) for after stress test . Or sooner if needed. --Continue follow-up with your primary care physician regarding the management of your other chronic comorbid conditions.  Patient's questions and concerns were addressed to her satisfaction. She voices understanding of the instructions provided during this encounter.   This note was created using a voice recognition software as a result there may be grammatical errors inadvertently enclosed that do not reflect the nature of this encounter. Every attempt is made to correct such errors.  Tessa Lerner, DO, [facility] Office: [phone]

## 2019-10-09 ENCOUNTER — Other Ambulatory Visit: Payer: Self-pay

## 2019-10-09 ENCOUNTER — Ambulatory Visit: Payer: BC Managed Care – PPO

## 2019-10-09 DIAGNOSIS — I10 Essential (primary) hypertension: Secondary | ICD-10-CM | POA: Diagnosis not present

## 2019-10-09 DIAGNOSIS — R0609 Other forms of dyspnea: Secondary | ICD-10-CM

## 2019-10-09 DIAGNOSIS — I2510 Atherosclerotic heart disease of native coronary artery without angina pectoris: Secondary | ICD-10-CM

## 2019-10-09 DIAGNOSIS — R072 Precordial pain: Secondary | ICD-10-CM

## 2019-10-23 ENCOUNTER — Encounter: Payer: Self-pay | Admitting: Cardiology

## 2019-10-23 ENCOUNTER — Other Ambulatory Visit: Payer: Self-pay

## 2019-10-23 ENCOUNTER — Ambulatory Visit: Payer: BC Managed Care – PPO | Admitting: Cardiology

## 2019-10-23 VITALS — BP 145/80 | HR 82 | Temp 97.3°F | Ht 64.0 in | Wt 157.0 lb

## 2019-10-23 DIAGNOSIS — R002 Palpitations: Secondary | ICD-10-CM

## 2019-10-23 DIAGNOSIS — I10 Essential (primary) hypertension: Secondary | ICD-10-CM

## 2019-10-23 DIAGNOSIS — Z712 Person consulting for explanation of examination or test findings: Secondary | ICD-10-CM

## 2019-10-23 DIAGNOSIS — I2510 Atherosclerotic heart disease of native coronary artery without angina pectoris: Secondary | ICD-10-CM

## 2019-10-23 DIAGNOSIS — R0609 Other forms of dyspnea: Secondary | ICD-10-CM | POA: Diagnosis not present

## 2019-10-23 MED ORDER — AMLODIPINE BESYLATE 10 MG PO TABS: 10.0000 mg | ORAL_TABLET | Freq: Every evening | ORAL | 1 refills | Status: AC

## 2019-10-23 MED ORDER — ASPIRIN EC 81 MG PO TBEC: 81.0000 mg | DELAYED_RELEASE_TABLET | Freq: Every day | ORAL | 3 refills | Status: AC

## 2019-10-23 NOTE — Progress Notes (Signed)
Levin Bacon Date of Birth: December 14, 1955 MRN: [id number] Primary Care Provider:Rankins, Bill Salinas, MD  Date: 10/23/19 Last Office Visit: 09/22/2019  Chief Complaint  Patient presents with   Shortness of Breath    test results   Follow-up    HPI  Jane Cuevas is a 64 y.o. female who presents to the office with a chief complaint of " follow-up on test results and shortness of breath" Patient's past medical history and cardiac risk factors include: Hypertension, hyperlipidemia, coronary artery calcification, postmenopausal female.  Patient was seen in consultation March 2021 in consultation for evaluation of palpitations, shortness of breath and family history of cardiac disease.    Since last office visit patient's palpitations have resolved.  She continues to be on metoprolol on a daily basis. Mobile cardiac ambulatory telemetry results illustrated that she was in normal sinus rhythm and no evidence of dysrhythmias, details noted below for further reference.  Since last office visit patient states that her shortness of breath has improved.  At last office visit her antihypertensive medications were uptitrated and her blood pressures have also improved.  Her systolic blood pressure now ranges between 631-497 mmHg and diastolic blood pressures range between 80-90 mmHg.  She understands that her blood pressures are currently still not at goal but are improving.  Given her symptoms of shortness of breath and coronary artery calcification in the left main she was recommended to undergo nuclear stress test at the last visit to evaluate for reversible ischemia.  Stress test results reviewed with the patient and it noted normal perfusion.  Given the coronary artery calcification and shortness of breath we did discuss possible cardiac CTA for further evaluation vs. better blood pressure management.  However, patient would like to hold off on additional cardiac testing at this point and focus  more on improving blood pressure management.  If her symptoms continue with better blood pressure management than will consider cardiac CTA at a later date.  Since last office visit patient has been taking Lipitor more regularly compared to last visit. She now takes it every other day.  She is recommended to take it on a daily basis.  History of coronary artery calcification. Denies prior history of myocardial infarction, congestive heart failure, deep venous thrombosis, pulmonary embolism, stroke, transient ischemic attack.  ALLERGIES: No Known Allergies   MEDICATION LIST PRIOR TO VISIT: Current Outpatient Medications on File Prior to Visit  Medication Sig Dispense Refill   atorvastatin (LIPITOR) 20 MG tablet Take 20 mg by mouth at bedtime.     metoprolol succinate (TOPROL-XL) 50 MG 24 hr tablet Take 50 mg by mouth daily.     valsartan (DIOVAN) 160 MG tablet Take 160 mg by mouth daily.     vitamin k 100 MCG tablet Take 100 mcg by mouth daily.     No current facility-administered medications on file prior to visit.    PAST MEDICAL HISTORY: Past Medical History:  Diagnosis Date   Coronary artery calcification    Hyperlipidemia    Hypertension     PAST SURGICAL HISTORY: Past Surgical History:  Procedure Laterality Date   cesarean     x2    FAMILY HISTORY: The patient family history includes Hypertension in her brother, sister, and sister.   SOCIAL HISTORY:  The patient  reports that she has never smoked. She has never used smokeless tobacco. She reports previous alcohol use. She reports that she does not use drugs.  Review of Systems  Constitution: Negative for chills  and fever.  HENT: Negative for hoarse voice and nosebleeds.   Eyes: Negative for discharge, double vision and pain.  Cardiovascular: Negative for chest pain, claudication, dyspnea on exertion, leg swelling, near-syncope, orthopnea, palpitations, paroxysmal nocturnal dyspnea and syncope.  Respiratory:  Negative for hemoptysis and shortness of breath.   Musculoskeletal: Negative for muscle cramps and myalgias.  Gastrointestinal: Negative for abdominal pain, constipation, diarrhea, hematemesis, hematochezia, melena, nausea and vomiting.  Neurological: Negative for dizziness and light-headedness.   PHYSICAL EXAM: Vitals with BMI 10/23/2019 09/22/2019 09/22/2019  Height 5\' 4"  - 5\' 4"   Weight 157 lbs - 160 lbs  BMI 26.94 - 27.45  Systolic 145 142  Diastolic 80 86 87  Pulse 82 - 77   CONSTITUTIONAL: Well-developed and well-nourished. No acute distress.  SKIN: Skin is warm and dry. No rash noted. No cyanosis. No pallor. No jaundice HEAD: Normocephalic and atraumatic.  EYES: No scleral icterus MOUTH/THROAT: Moist oral membranes.  NECK: No JVD present. No thyromegaly noted. No carotid bruits  LYMPHATIC: No visible cervical adenopathy.  CHEST Normal respiratory effort. No intercostal retractions  LUNGS: Clear to auscultation bilaterally.  No stridor. No wheezes. No rales.  CARDIOVASCULAR: Regular rate and rhythm, positive S1-S2, no murmurs rubs or gallops appreciated. ABDOMINAL: Soft, nontender, nondistended, positive bowel sounds in all 4 quadrants.  No apparent ascites.  EXTREMITIES: No peripheral edema  HEMATOLOGIC: No significant bruising NEUROLOGIC: Oriented to person, place, and time. Nonfocal. Normal muscle tone.  PSYCHIATRIC: Normal mood and affect. Normal behavior. Cooperative  CARDIAC DATABASE: EKG: 08/22/2019: Normal sinus rhythm ventricular rate of 76 bpm, normal axis deviation, left atrial enlargement, no underlying ischemia or injury pattern.   Echocardiogram: 09/07/2019: Normal LV systolic function with EF 67%. Left ventricle cavity is normal in size. Normal left ventricular wall thickness. Normal global wall motion. Normal diastolic filling pattern. Calculated EF 67%. Structurally normal tricuspid valve.  Mild tricuspid regurgitation. No evidence of pulmonary  hypertension.  Stress Testing:  Lexiscan (Walking with mod Bruce) Sestamibi Stress Test 10/09/2019: Myocardial perfusion is normal. Overall LV systolic function is normal without regional wall motion abnormalities. Stress LV EF: 71%. Low risk.   Heart Catheterization: None  Coronary artery calcification scoring performed on 09/04/2019 at [facility]: Left main: 22 Left anterior descending: 0 Left circumflex: 1 Right coronary artery: 0 Total calcium score: 23 AU, which is between 50th and 75th percentile for female patient this age.  LABORATORY DATA: External Labs: Collected: August 08, 2019 Creatinine 0.78 mg/dL. Lipid profile: Total cholesterol 159, triglycerides 129, HDL 51, LDL 85 TSH: 4.47  Lipid profile: Collected: 08/08/2019 Total cholesterol 159, triglycerides 128, HDL 51, LDL 85, non-HDL 108. Collected: 09/04/2019: Total cholesterol 279, triglycerides 130, HDL 64, LDL 189, non-HDL 192 Collected 09/29/2019: Total cholesterol 193, triglycerides 141, HDL 53, LDL 111, non-HDL 115  FINAL MEDICATION LIST END OF ENCOUNTER: Meds ordered this encounter  Medications   amLODipine (NORVASC) 10 MG tablet    Sig: Take 1 tablet (10 mg total) by mouth every evening.    Dispense:  90 tablet    Refill:  1   aspirin EC 81 MG tablet    Sig: Take 1 tablet (81 mg total) by mouth daily.    Dispense:  90 tablet    Refill:  3    Medications Discontinued During This Encounter  Medication Reason   amLODipine (NORVASC) 5 MG tablet      Current Outpatient Medications:    amLODipine (NORVASC) 10 MG tablet, Take 1 tablet (10 mg total)  by mouth every evening., Disp: 90 tablet, Rfl: 1   atorvastatin (LIPITOR) 20 MG tablet, Take 20 mg by mouth at bedtime., Disp: , Rfl:    metoprolol succinate (TOPROL-XL) 50 MG 24 hr tablet, Take 50 mg by mouth daily., Disp: , Rfl:    valsartan (DIOVAN) 160 MG tablet, Take 160 mg by mouth daily., Disp: , Rfl:    vitamin k 100 MCG tablet, Take 100 mcg  by mouth daily., Disp: , Rfl:    aspirin EC 81 MG tablet, Take 1 tablet (81 mg total) by mouth daily., Disp: 90 tablet, Rfl: 3  IMPRESSION:    ICD-10-CM   1. Dyspnea on exertion  R06.00   2. Benign hypertension  I10 amLODipine (NORVASC) 10 MG tablet  3. Palpitations  R00.2   4. Coronary artery calcification seen on computed tomography  I25.10 aspirin EC 81 MG tablet  5. Encounter to discuss test results  Z71.2      RECOMMENDATIONS: Jonai Weyland is a 64 y.o. female whose past medical history and cardiac risk factors include: Hypertension, hyperlipidemia, coronary artery calcification, postmenopausal female.  Dyspnea on exertion: Improving.   Increase Norvasc to 10mg  po qpm.   Stress test reviewed with patient.   Discussed undergoing cardiac CCTA and shared decision was to hold off on additional cardiac testing at this point and focus more on improving BP management.  If her symptoms continue despite better BP management than will consider cardiac CTA at a later date.  Recommend close follow-up.  In the meantime patient verbalized understanding that if her symptoms increase in intensity, frequency, duration or has typical chest pain she will seek medical attention at the closest ER via EMS.  Palpitations: Resolved.  Mobile telemetry reviewed with the patient.  Findings conveyed to the patient.  Continue beta-blocker therapy.  Benign essential hypertension: Improving  Blood pressure is currently not at goal.  Patient is asked to restart valsartan.    Increase amlodipine to 10mg  po qPM.  Low salt diet recommended. A diet that is rich in fruits, vegetables, legumes, and low-fat dairy products and low in snacks, sweets, and meats (such as the Dietary Approaches to Stop Hypertension [DASH] diet).   Mixed hyperlipidemia:  Started on statin therapy due to coronary artery calcifications.  Patient is encouraged to take statin therapy on a regular basis.  Follow  lipids.  Most recent lipid profile reviewed with the patient.  No orders of the defined types were placed in this encounter.  --Continue cardiac medications as reconciled in final medication list. --Return in about 3 months (around 01/23/2020) for re-evaluation of symptoms.. Or sooner if needed. --Continue follow-up with your primary care physician regarding the management of your other chronic comorbid conditions.  Patient's questions and concerns were addressed to her satisfaction. She voices understanding of the instructions provided during this encounter.   This note was created using a voice recognition software as a result there may be grammatical errors inadvertently enclosed that do not reflect the nature of this encounter. Every attempt is made to correct such errors.  , DO, [facility] Office: [phone]

## 2019-10-27 ENCOUNTER — Telehealth: Payer: Self-pay | Admitting: Unknown Physician Specialty

## 2019-10-27 DIAGNOSIS — U071 COVID-19: Secondary | ICD-10-CM | POA: Diagnosis not present

## 2019-10-27 DIAGNOSIS — Z20822 Contact with and (suspected) exposure to covid-19: Secondary | ICD-10-CM | POA: Diagnosis not present

## 2019-10-27 DIAGNOSIS — J22 Unspecified acute lower respiratory infection: Secondary | ICD-10-CM | POA: Diagnosis not present

## 2019-10-27 DIAGNOSIS — Z03818 Encounter for observation for suspected exposure to other biological agents ruled out: Secondary | ICD-10-CM | POA: Diagnosis not present

## 2019-10-27 DIAGNOSIS — R05 Cough: Secondary | ICD-10-CM | POA: Diagnosis not present

## 2019-10-27 NOTE — Telephone Encounter (Signed)
Called to discuss with Astrid Divine about Covid symptoms and the use of bamlanivimab, a monoclonal antibody infusion for those with mild to moderate Covid symptoms and at a high risk of hospitalization.     Pt is qualified for this infusion at the [facility] infusion center due to co-morbid conditions and/or a member of an at-risk group, however declines infusion at this time. Symptoms tier reviewed as well as criteria for ending isolation.  Symptoms reviewed that would warrant ED/Hospital evaluation. Preventative practices reviewed. Patient verbalized understanding. Patient advised to call back if he decides that he does want to get infusion. Callback number to the infusion center given. Patient advised to go to Urgent care or ED with severe symptoms. Last date  would be eligible for infusion is 11/01/2019.      There are no problems to display for this patient.

## 2019-10-30 DIAGNOSIS — U071 COVID-19: Secondary | ICD-10-CM | POA: Diagnosis not present

## 2019-10-30 DIAGNOSIS — J22 Unspecified acute lower respiratory infection: Secondary | ICD-10-CM | POA: Diagnosis not present

## 2020-01-23 ENCOUNTER — Ambulatory Visit: Payer: BC Managed Care – PPO | Admitting: Cardiology

## 2020-04-25 DIAGNOSIS — Z1231 Encounter for screening mammogram for malignant neoplasm of breast: Secondary | ICD-10-CM | POA: Diagnosis not present

## 2020-04-25 DIAGNOSIS — Z01419 Encounter for gynecological examination (general) (routine) without abnormal findings: Secondary | ICD-10-CM | POA: Diagnosis not present

## 2020-08-22 ENCOUNTER — Other Ambulatory Visit: Payer: Self-pay | Admitting: Cardiology

## 2020-08-22 DIAGNOSIS — I10 Essential (primary) hypertension: Secondary | ICD-10-CM

## 2024-06-09 ENCOUNTER — Emergency Department (HOSPITAL_COMMUNITY)

## 2024-06-09 ENCOUNTER — Ambulatory Visit (HOSPITAL_COMMUNITY)
Admission: EM | Admit: 2024-06-09 | Discharge: 2024-06-09 | Disposition: A | Discharge status: Home / Self Care | Attending: Emergency Medicine | Admitting: Emergency Medicine

## 2024-06-09 ENCOUNTER — Encounter (HOSPITAL_COMMUNITY)
Admission: EM | Disposition: A | Discharge status: Home / Self Care | Payer: Self-pay | Source: Home / Self Care | Attending: Emergency Medicine

## 2024-06-09 ENCOUNTER — Emergency Department (HOSPITAL_COMMUNITY): Admitting: Certified Registered Nurse Anesthetist

## 2024-06-09 ENCOUNTER — Encounter (HOSPITAL_COMMUNITY): Payer: Self-pay | Admitting: Emergency Medicine

## 2024-06-09 ENCOUNTER — Other Ambulatory Visit: Payer: Self-pay

## 2024-06-09 DIAGNOSIS — R109 Unspecified abdominal pain: Secondary | ICD-10-CM | POA: Diagnosis present

## 2024-06-09 DIAGNOSIS — I10 Essential (primary) hypertension: Secondary | ICD-10-CM | POA: Diagnosis not present

## 2024-06-09 DIAGNOSIS — I2510 Atherosclerotic heart disease of native coronary artery without angina pectoris: Secondary | ICD-10-CM | POA: Diagnosis not present

## 2024-06-09 DIAGNOSIS — K8012 Calculus of gallbladder with acute and chronic cholecystitis without obstruction: Secondary | ICD-10-CM | POA: Insufficient documentation

## 2024-06-09 DIAGNOSIS — I700 Atherosclerosis of aorta: Secondary | ICD-10-CM | POA: Diagnosis not present

## 2024-06-09 DIAGNOSIS — K810 Acute cholecystitis: Secondary | ICD-10-CM

## 2024-06-09 DIAGNOSIS — Z8249 Family history of ischemic heart disease and other diseases of the circulatory system: Secondary | ICD-10-CM | POA: Insufficient documentation

## 2024-06-09 HISTORY — PX: CHOLECYSTECTOMY: SHX55

## 2024-06-09 LAB — CBC WITH DIFFERENTIAL/PLATELET
Abs Immature Granulocytes: 0.03 K/uL (ref 0.00–0.07)
Basophils Absolute: 0.1 K/uL (ref 0.0–0.1)
Basophils Relative: 1 %
Eosinophils Absolute: 0.1 K/uL (ref 0.0–0.5)
Eosinophils Relative: 2 %
HCT: 42.6 % (ref 36.0–46.0)
Hemoglobin: 14.4 g/dL (ref 12.0–15.0)
Immature Granulocytes: 0 %
Lymphocytes Relative: 21 %
Lymphs Abs: 1.9 K/uL (ref 0.7–4.0)
MCH: 30.7 pg (ref 26.0–34.0)
MCHC: 33.8 g/dL (ref 30.0–36.0)
MCV: 90.8 fL (ref 80.0–100.0)
Monocytes Absolute: 0.7 K/uL (ref 0.1–1.0)
Monocytes Relative: 8 %
Neutro Abs: 5.9 K/uL (ref 1.7–7.7)
Neutrophils Relative %: 68 %
Platelets: 243 K/uL (ref 150–400)
RBC: 4.69 MIL/uL (ref 3.87–5.11)
RDW: 13.3 % (ref 11.5–15.5)
WBC: 8.6 K/uL (ref 4.0–10.5)
nRBC: 0 % (ref 0.0–0.2)

## 2024-06-09 LAB — TROPONIN T, HIGH SENSITIVITY: Troponin T High Sensitivity: <15 ng/L (ref 0–19)

## 2024-06-09 LAB — COMPREHENSIVE METABOLIC PANEL WITH GFR
ALT: 72 U/L — ABNORMAL HIGH (ref 0–44)
AST: 31 U/L (ref 15–41)
Albumin: 4.8 g/dL (ref 3.5–5.0)
Alkaline Phosphatase: 127 U/L — ABNORMAL HIGH (ref 38–126)
Anion gap: 14 (ref 5–15)
BUN: 10 mg/dL (ref 8–23)
CO2: 22 mmol/L (ref 22–32)
Calcium: 9.9 mg/dL (ref 8.9–10.3)
Chloride: 103 mmol/L (ref 98–111)
Creatinine, Ser: 0.8 mg/dL (ref 0.44–1.00)
GFR, Estimated: >60 mL/min
Glucose, Bld: 134 mg/dL — ABNORMAL HIGH (ref 70–99)
Potassium: 3.9 mmol/L (ref 3.5–5.1)
Sodium: 138 mmol/L (ref 135–145)
Total Bilirubin: 0.4 mg/dL (ref 0.0–1.2)
Total Protein: 7.6 g/dL (ref 6.5–8.1)

## 2024-06-09 LAB — URINALYSIS, ROUTINE W REFLEX MICROSCOPIC
Bilirubin Urine: NEGATIVE
Glucose, UA: 50 mg/dL — AB
Hgb urine dipstick: NEGATIVE
Ketones, ur: 5 mg/dL — AB
Leukocytes,Ua: NEGATIVE
Nitrite: NEGATIVE
Protein, ur: NEGATIVE mg/dL
Specific Gravity, Urine: 1.041 — ABNORMAL HIGH (ref 1.005–1.030)
pH: 8 (ref 5.0–8.0)

## 2024-06-09 LAB — RESP PANEL BY RT-PCR (RSV, FLU A&B, COVID)  RVPGX2
Influenza A by PCR: NEGATIVE
Influenza B by PCR: NEGATIVE
Resp Syncytial Virus by PCR: NEGATIVE
SARS Coronavirus 2 by RT PCR: NEGATIVE

## 2024-06-09 LAB — LIPASE, BLOOD: Lipase: 38 U/L (ref 11–51)

## 2024-06-09 SURGERY — LAPAROSCOPIC CHOLECYSTECTOMY
Anesthesia: General

## 2024-06-09 MED ORDER — INDOCYANINE GREEN 25 MG IJ SOLR
1.2500 mg | INTRAMUSCULAR | Status: AC
  Administered 2024-06-09: 1.25 mg via INTRAVENOUS

## 2024-06-09 MED ORDER — MIDAZOLAM HCL (PF) 2 MG/2ML IJ SOLN
INTRAMUSCULAR | Status: DC | PRN
  Administered 2024-06-09: 2 mg via INTRAVENOUS

## 2024-06-09 MED ORDER — FENTANYL CITRATE (PF) 100 MCG/2ML IJ SOLN
INTRAMUSCULAR | Status: AC
  Filled 2024-06-09: qty 2

## 2024-06-09 MED ORDER — FENTANYL CITRATE (PF) 100 MCG/2ML IJ SOLN
INTRAMUSCULAR | Status: DC | PRN
  Administered 2024-06-09 (×2): 50 ug via INTRAVENOUS

## 2024-06-09 MED ORDER — HYDROMORPHONE HCL 1 MG/ML IJ SOLN
INTRAMUSCULAR | Status: AC
  Filled 2024-06-09: qty 0.5

## 2024-06-09 MED ORDER — ONDANSETRON HCL 4 MG/2ML IJ SOLN
4.0000 mg | Freq: Once | INTRAMUSCULAR | Status: AC
  Administered 2024-06-09: 4 mg via INTRAVENOUS
  Filled 2024-06-09: qty 2

## 2024-06-09 MED ORDER — HYDROMORPHONE HCL 1 MG/ML IJ SOLN
1.0000 mg | Freq: Once | INTRAMUSCULAR | Status: AC
  Administered 2024-06-09: 1 mg via INTRAVENOUS
  Filled 2024-06-09: qty 1

## 2024-06-09 MED ORDER — ACETAMINOPHEN 500 MG PO TABS: 1000.0000 mg | ORAL_TABLET | Freq: Four times a day (QID) | ORAL | 3 refills | Status: AC

## 2024-06-09 MED ORDER — LIDOCAINE 2% (20 MG/ML) 5 ML SYRINGE
INTRAMUSCULAR | Status: DC | PRN
  Administered 2024-06-09: 60 mg via INTRAVENOUS

## 2024-06-09 MED ORDER — ORAL CARE MOUTH RINSE: 15.0000 mL | Freq: Once | OROMUCOSAL | Status: AC

## 2024-06-09 MED ORDER — FAMOTIDINE 20 MG PO TABS
20.0000 mg | ORAL_TABLET | Freq: Once | ORAL | Status: AC
  Administered 2024-06-09: 20 mg via ORAL
  Filled 2024-06-09: qty 1

## 2024-06-09 MED ORDER — LABETALOL HCL 5 MG/ML IV SOLN
INTRAVENOUS | Status: DC | PRN
  Administered 2024-06-09: 5 mg via INTRAVENOUS

## 2024-06-09 MED ORDER — PROPOFOL 10 MG/ML IV BOLUS
INTRAVENOUS | Status: DC | PRN
  Administered 2024-06-09: 130 mg via INTRAVENOUS

## 2024-06-09 MED ORDER — LACTATED RINGERS IV SOLN: INTRAVENOUS | Status: DC

## 2024-06-09 MED ORDER — DEXAMETHASONE SOD PHOSPHATE PF 10 MG/ML IJ SOLN
INTRAMUSCULAR | Status: DC | PRN
  Administered 2024-06-09: 10 mg via INTRAVENOUS

## 2024-06-09 MED ORDER — METHOCARBAMOL 750 MG PO TABS: 750.0000 mg | ORAL_TABLET | Freq: Four times a day (QID) | ORAL | 1 refills | Status: AC

## 2024-06-09 MED ORDER — ONDANSETRON HCL 4 MG/2ML IJ SOLN
INTRAMUSCULAR | Status: AC
  Filled 2024-06-09: qty 6

## 2024-06-09 MED ORDER — SODIUM CHLORIDE 0.9 % IV SOLN
2.0000 g | INTRAVENOUS | Status: AC
  Administered 2024-06-09: 2 g via INTRAVENOUS
  Filled 2024-06-09: qty 20

## 2024-06-09 MED ORDER — AMISULPRIDE (ANTIEMETIC) 5 MG/2ML IV SOLN: 10.0000 mg | Freq: Once | INTRAVENOUS | Status: DC | PRN

## 2024-06-09 MED ORDER — SUGAMMADEX SODIUM 200 MG/2ML IV SOLN
INTRAVENOUS | Status: DC | PRN
  Administered 2024-06-09: 200 mg via INTRAVENOUS

## 2024-06-09 MED ORDER — CHLORHEXIDINE GLUCONATE 0.12 % MT SOLN
15.0000 mL | Freq: Once | OROMUCOSAL | Status: AC
  Administered 2024-06-09: 15 mL via OROMUCOSAL

## 2024-06-09 MED ORDER — ALUM & MAG HYDROXIDE-SIMETH 200-200-20 MG/5ML PO SUSP
30.0000 mL | Freq: Once | ORAL | Status: AC
  Administered 2024-06-09: 30 mL via ORAL
  Filled 2024-06-09: qty 30

## 2024-06-09 MED ORDER — LIDOCAINE 2% (20 MG/ML) 5 ML SYRINGE
INTRAMUSCULAR | Status: AC
  Filled 2024-06-09: qty 20

## 2024-06-09 MED ORDER — MIDAZOLAM HCL 2 MG/2ML IJ SOLN
INTRAMUSCULAR | Status: AC
  Filled 2024-06-09: qty 2

## 2024-06-09 MED ORDER — PROPOFOL 10 MG/ML IV BOLUS
INTRAVENOUS | Status: AC
  Filled 2024-06-09: qty 20

## 2024-06-09 MED ORDER — OXYCODONE HCL 5 MG/5ML PO SOLN: 5.0000 mg | Freq: Once | ORAL | Status: DC | PRN

## 2024-06-09 MED ORDER — LACTATED RINGERS IV BOLUS
1000.0000 mL | Freq: Once | INTRAVENOUS | Status: AC
  Administered 2024-06-09: 1000 mL via INTRAVENOUS

## 2024-06-09 MED ORDER — IBUPROFEN 600 MG PO TABS: 600.0000 mg | ORAL_TABLET | Freq: Four times a day (QID) | ORAL | 1 refills | Status: AC | PRN

## 2024-06-09 MED ORDER — IOHEXOL 350 MG/ML SOLN
75.0000 mL | Freq: Once | INTRAVENOUS | Status: AC | PRN
  Administered 2024-06-09: 75 mL via INTRAVENOUS

## 2024-06-09 MED ORDER — POLYETHYLENE GLYCOL 3350 17 G PO PACK: 17.0000 g | PACK | Freq: Every day | ORAL | 1 refills | Status: AC

## 2024-06-09 MED ORDER — EPHEDRINE 5 MG/ML INJ
INTRAVENOUS | Status: AC
  Filled 2024-06-09: qty 10

## 2024-06-09 MED ORDER — ROCURONIUM BROMIDE 10 MG/ML (PF) SYRINGE
PREFILLED_SYRINGE | INTRAVENOUS | Status: AC
  Filled 2024-06-09: qty 40

## 2024-06-09 MED ORDER — HYDROMORPHONE HCL 1 MG/ML IJ SOLN
INTRAMUSCULAR | Status: DC | PRN
  Administered 2024-06-09: .5 mg via INTRAVENOUS

## 2024-06-09 MED ORDER — FENTANYL CITRATE (PF) 50 MCG/ML IJ SOSY
50.0000 ug | PREFILLED_SYRINGE | INTRAMUSCULAR | Status: DC | PRN
  Administered 2024-06-09 (×2): 50 ug via INTRAVENOUS
  Filled 2024-06-09 (×2): qty 1

## 2024-06-09 MED ORDER — BUPIVACAINE HCL 0.25 % IJ SOLN
INTRAMUSCULAR | Status: DC | PRN
  Administered 2024-06-09: 30 mL

## 2024-06-09 MED ORDER — FENTANYL CITRATE (PF) 100 MCG/2ML IJ SOLN: 25.0000 ug | INTRAMUSCULAR | Status: DC | PRN

## 2024-06-09 MED ORDER — OXYCODONE HCL 5 MG PO TABS: 5.0000 mg | ORAL_TABLET | Freq: Once | ORAL | Status: DC | PRN

## 2024-06-09 MED ORDER — PANTOPRAZOLE SODIUM 40 MG IV SOLR
40.0000 mg | Freq: Once | INTRAVENOUS | Status: AC
  Administered 2024-06-09: 40 mg via INTRAVENOUS
  Filled 2024-06-09: qty 10

## 2024-06-09 MED ORDER — OXYCODONE HCL 5 MG PO TABS: 5.0000 mg | ORAL_TABLET | ORAL | 0 refills | Status: AC | PRN

## 2024-06-09 MED ORDER — ONDANSETRON HCL 4 MG/2ML IJ SOLN
INTRAMUSCULAR | Status: DC | PRN
  Administered 2024-06-09: 4 mg via INTRAVENOUS

## 2024-06-09 MED ORDER — ROCURONIUM BROMIDE 10 MG/ML (PF) SYRINGE
PREFILLED_SYRINGE | INTRAVENOUS | Status: DC | PRN
  Administered 2024-06-09: 50 mg via INTRAVENOUS

## 2024-06-09 MED ORDER — LABETALOL HCL 5 MG/ML IV SOLN
INTRAVENOUS | Status: AC
  Filled 2024-06-09: qty 4

## 2024-06-09 SURGICAL SUPPLY — 33 items
BLADE CLIPPER SURG (BLADE) IMPLANT
CANISTER SUCTION 3000ML PPV (SUCTIONS) ×1 IMPLANT
CHLORAPREP W/TINT 26 (MISCELLANEOUS) ×1 IMPLANT
CLIP APPLIE 5 13 M/L LIGAMAX5 (MISCELLANEOUS) ×1 IMPLANT
COVER SURGICAL LIGHT HANDLE (MISCELLANEOUS) ×1 IMPLANT
DERMABOND ADVANCED .7 DNX12 (GAUZE/BANDAGES/DRESSINGS) ×1 IMPLANT
DISSECTOR BLUNT TIP ENDO 5MM (MISCELLANEOUS) IMPLANT
ELECT CAUTERY BLADE 6.4 (BLADE) ×1 IMPLANT
ELECTRODE REM PT RTRN 9FT ADLT (ELECTROSURGICAL) ×1 IMPLANT
GLOVE BIO SURGEON STRL SZ 6.5 (GLOVE) ×1 IMPLANT
GLOVE BIOGEL PI IND STRL 6 (GLOVE) ×1 IMPLANT
GOWN STRL REUS W/ TWL LRG LVL3 (GOWN DISPOSABLE) ×3 IMPLANT
IRRIGATION SUCT STRKRFLW 2 WTP (MISCELLANEOUS) IMPLANT
KIT BASIN OR (CUSTOM PROCEDURE TRAY) ×1 IMPLANT
KIT IMAGING PINPOINTPAQ (MISCELLANEOUS) IMPLANT
KIT TURNOVER KIT B (KITS) ×1 IMPLANT
PAD ARMBOARD POSITIONER FOAM (MISCELLANEOUS) ×1 IMPLANT
PENCIL BUTTON HOLSTER BLD 10FT (ELECTRODE) ×1 IMPLANT
POUCH RETRIEVAL ECOSAC 10 (ENDOMECHANICALS) ×1 IMPLANT
SCISSORS LAP 5X35 DISP (ENDOMECHANICALS) ×1 IMPLANT
SET TUBE SMOKE EVAC HIGH FLOW (TUBING) ×1 IMPLANT
SLEEVE Z-THREAD 5X100MM (TROCAR) ×2 IMPLANT
SOLN 0.9% NACL POUR BTL 1000ML (IV SOLUTION) ×1 IMPLANT
SOLN STERILE WATER BTL 1000 ML (IV SOLUTION) ×1 IMPLANT
SUT MNCRL AB 4-0 PS2 18 (SUTURE) ×1 IMPLANT
SUT VIC AB 0 UR5 27 (SUTURE) IMPLANT
SUT VICRYL 0 AB UR-6 (SUTURE) IMPLANT
SUT VICRYL 0 UR6 27IN ABS (SUTURE) IMPLANT
TOWEL GREEN STERILE FF (TOWEL DISPOSABLE) ×1 IMPLANT
TRAY LAPAROSCOPIC MC (CUSTOM PROCEDURE TRAY) ×1 IMPLANT
TROCAR BALLN 12MMX100 BLUNT (TROCAR) ×1 IMPLANT
TROCAR Z-THREAD OPTICAL 5X100M (TROCAR) ×1 IMPLANT
WARMER LAPAROSCOPE (MISCELLANEOUS) ×1 IMPLANT

## 2024-06-09 NOTE — Op Note (Signed)
" ° °  Operative Note  Date: 06/09/2024  Procedure: laparoscopic cholecystectomy  Pre-op diagnosis: symptomatic cholelithiasis vs acute cholecystitis Post-op diagnosis: acute cholecystitis  Indication and clinical history: The patient is a 68 y.o. year old female with symptomatic cholelithiasis vs acute cholecystitis  Surgeon: Dreama GEANNIE Hanger, MD  Anesthesiologist: Niels, MD Anesthesia: General  Findings:  Specimen: gallbladder EBL: 5cc Drains/Implants: none  Disposition: PACU - hemodynamically stable.  Description of procedure: The patient was positioned supine on the operating room table. Time-out was performed verifying correct patient, procedure, signature of informed consent, and administration of pre-operative antibiotics. General anesthetic induction and intubation were uneventful. The abdomen was prepped and draped in the usual sterile fashion. A supra-umbilical incision was made using an open technique using zero vicryl stay sutures on either side of the fascia and a 10mm Hassan port inserted. After establishing pneumoperitoneum, which the patient tolerated well, the abdominal cavity was inspected and no injury of any intra-abdominal structures was identified. Additional ports were placed under direct visualization and using local anesthetic: two 5mm ports in the right subcostal region and a 5mm port in the epigastric region. The patient was re-positioned to reverse Trendelenburg and right side up. The gallbladder was tense and mottled, requiring aspiration before it could be grasped and then retracted cephalad. The infundibulum was identified and retracted toward the right lower quadrant. The peritoneum was incised over the infundibulum and the triangle of Calot dissected to expose the critical view of safety. With clear identification and isolation of the cystic duct and cystic artery, the cystic artery was doubly clipped and divided. After this, the cystic duct was identified as a  single structure entering the gallbladder, and was also doubly clipped and divided. The gallbladder was dissected off the liver bed using electrocautery and hemostasis of the liver bed was confirmed prior to separation of the final peritoneal attachments of the gallbladder to the liver bed. The gallbladder fossa was irrigated and fluid returned clear. After transection of the final peritoneal attachments, the gallbladder was placed in an endoscopic specimen retrieval bag, removed via the umbilical port site, and sent to pathology as a permanent specimen. The gallbladder fossa was inspected confirming hemostasis, the absence of bile leakage from the cystic duct stump, and correct placement of clips on the cystic artery and cystic duct stumps. The abdomen was desufflated and the fascia of the umbilical port site was closed using the previously placed stay sutures. Additional local anesthetic was administered at the umbilical port site.  The skin of all incisions was closed with 4-0 monocryl. Sterile dressings were applied. All sponge and instrument counts were correct at the conclusion of the procedure. The patient was awakened from anesthesia, extubated uneventfully, and transported to the PACU - hemodynamically stable.. There were no complications.   Upon entering the abdomen (organ space), I encountered infection of the gallbladder.  CASE DATA:  Type of patient?: DOW CASE (Surgical Hospitalist [facility])  Status of Case? URGENT Add On  Infection Present At Time Of Surgery (PATOS)?  INFECTION of the gallbladder    Dreama GEANNIE Hanger, MD General and Trauma Surgery [facility] Surgery  "

## 2024-06-09 NOTE — H&P (Signed)
 "    Jane Cuevas 05/24/1956  [id number].    Requesting MD: Dr. Ozell Arts Chief Complaint/Reason for Consult: Acute Cholecystitis  HPI: Jane Cuevas is a 68 y.o. female with a hx of CAD, HTN, HLD who presented to the ED for abdominal pain.  Patient reports that around 9 PM yesterday she began having MEG and RUQ pain after eating beef. Associated n/v. No fever, cp, sob, urinary symptoms or change in bowel habits. Last BM today and normal. Hx of similar symptoms 4-5 days ago after PO intake that self resolve. No hx of ulcer or frequent nsaid use.   History of UHR as a child patient and C-section x 2.  She is not on blood thinners. No tobacco, alcohol or drug use. Last PO intake yesterday. She continues to have pain after PO intake.   ROS: ROS As above, see hpi  Family History  Problem Relation Age of Onset   Hypertension Sister    Hypertension Brother    Hypertension Sister     Past Medical History:  Diagnosis Date   Coronary artery calcification    Hyperlipidemia    Hypertension     Past Surgical History:  Procedure Laterality Date   cesarean     x2    Social History:  reports that she has never smoked. She has never used smokeless tobacco. She reports that she does not currently use alcohol. She reports that she does not use drugs.  Allergies: Allergies[1]  (Not in a hospital admission)    Physical Exam: Blood pressure (!) 173/86, pulse 65, temperature 98.5 F (36.9 C), resp. rate 15, SpO2 100%. General: pleasant, WD/WN female who is laying in bed in NAD HEENT: head is normocephalic, atraumatic.  Sclera are non-icteric.  Heart: regular, rate, and rhythm.   Lungs: CTAB, no wheezes, rhonchi, or rales noted.  Respiratory effort nonlabored Abd: Soft, ND, MEG and RUQ ttp. No masses, hernias, or organomegaly MS: no BUE or BLE edema Skin: warm and dry  Psych: A&Ox4 with an appropriate affect Neuro: normal speech, thought process intact, moves all  extremities, gait not assessed  Results for orders placed or performed during the hospital encounter of 06/09/24 (from the past 48 hours)  Resp panel by RT-PCR (RSV, Flu A&B, Covid) Anterior Nasal Swab     Status: None   Collection Time: 06/09/24  4:24 AM   Specimen: Anterior Nasal Swab  Result Value Ref Range   SARS Coronavirus 2 by RT PCR NEGATIVE NEGATIVE   Influenza A by PCR NEGATIVE NEGATIVE   Influenza B by PCR NEGATIVE NEGATIVE    Comment: (NOTE) The Xpert Xpress SARS-CoV-2/FLU/RSV plus assay is intended as an aid in the diagnosis of influenza from Nasopharyngeal swab specimens and should not be used as a sole basis for treatment. Nasal washings and aspirates are unacceptable for Xpert Xpress SARS-CoV-2/FLU/RSV testing.  Fact Sheet for Patients: bloggercourse.com  Fact Sheet for Healthcare Providers: seriousbroker.it  This test is not yet approved or cleared by the [place]  FDA and has been authorized for detection and/or diagnosis of SARS-CoV-2 by FDA under an Emergency Use Authorization (EUA). This EUA will remain in effect (meaning this test can be used) for the duration of the COVID-19 declaration under Section 564(b)(1) of the Act, 21 U.S.C. section 360bbb-3(b)(1), unless the authorization is terminated or revoked.     Resp Syncytial Virus by PCR NEGATIVE NEGATIVE    Comment: (NOTE) Fact Sheet for Patients: bloggercourse.com  Fact Sheet for Healthcare Providers: seriousbroker.it  This test is not yet approved or cleared by the [place]  FDA and has been authorized for detection and/or diagnosis of SARS-CoV-2 by FDA under an Emergency Use Authorization (EUA). This EUA will remain in effect (meaning this test can be used) for the duration of the COVID-19 declaration under Section 564(b)(1) of the Act, 21 U.S.C. section 360bbb-3(b)(1), unless the  authorization is terminated or revoked.  Performed at [facility], [address] [place], [place] [address]   Urinalysis, Routine w reflex microscopic -Urine, Clean Catch     Status: Abnormal   Collection Time: 06/09/24  4:26 AM  Result Value Ref Range   Color, Urine STRAW (A) YELLOW   APPearance CLEAR CLEAR   Specific Gravity, Urine 1.041 (H) 1.005 - 1.030   pH 8.0 5.0 - 8.0   Glucose, UA 50 (A) NEGATIVE mg/dL   Hgb urine dipstick NEGATIVE NEGATIVE   Bilirubin Urine NEGATIVE NEGATIVE   Ketones, ur 5 (A) NEGATIVE mg/dL   Protein, ur NEGATIVE NEGATIVE mg/dL   Nitrite NEGATIVE NEGATIVE   Leukocytes,Ua NEGATIVE NEGATIVE    Comment: Performed at [facility], [address], [place], [place] [address]  CBC with Differential     Status: None   Collection Time: 06/09/24  4:45 AM  Result Value Ref Range   WBC 8.6 4.0 - 10.5 K/uL   RBC 4.69 3.87 - 5.11 MIL/uL   Hemoglobin 14.4 12.0 - 15.0 g/dL   HCT 57.3 63.9 - 53.9 %   MCV 90.8 80.0 - 100.0 fL   MCH 30.7 26.0 - 34.0 pg   MCHC 33.8 30.0 - 36.0 g/dL   RDW 86.6 88.4 - 84.4 %   Platelets 243 150 - 400 K/uL   nRBC 0.0 0.0 - 0.2 %   Neutrophils Relative % 68 %   Neutro Abs 5.9 1.7 - 7.7 K/uL   Lymphocytes Relative 21 %   Lymphs Abs 1.9 0.7 - 4.0 K/uL   Monocytes Relative 8 %   Monocytes Absolute 0.7 0.1 - 1.0 K/uL   Eosinophils Relative 2 %   Eosinophils Absolute 0.1 0.0 - 0.5 K/uL   Basophils Relative 1 %   Basophils Absolute 0.1 0.0 - 0.1 K/uL   Immature Granulocytes 0 %   Abs Immature Granulocytes 0.03 0.00 - 0.07 K/uL    Comment: Performed at [facility], [address] [place], [place] [address]  Comprehensive metabolic panel     Status: Abnormal   Collection Time: 06/09/24  4:45 AM  Result Value Ref Range   Sodium 138 135 - 145 mmol/L   Potassium 3.9 3.5 - 5.1 mmol/L    Comment: HEMOLYSIS AT THIS LEVEL MAY AFFECT RESULT   Chloride 103 98 - 111 mmol/L   CO2 22 22 - 32 mmol/L   Glucose, Bld 134 (H)  70 - 99 mg/dL    Comment: Glucose reference range applies only to samples taken after fasting for at least 8 hours.   BUN 10 8 - 23 mg/dL   Creatinine, Ser 9.19 0.44 - 1.00 mg/dL   Calcium 9.9 8.9 - 89.6 mg/dL   Total Protein 7.6 6.5 - 8.1 g/dL   Albumin 4.8 3.5 - 5.0 g/dL   AST 31 15 - 41 U/L    Comment: HEMOLYSIS AT THIS LEVEL MAY AFFECT RESULT   ALT 72 (H) 0 - 44 U/L   Alkaline Phosphatase 127 (H) 38 - 126 U/L   Total Bilirubin 0.4 0.0 - 1.2 mg/dL  GFR, Estimated >60 >60 mL/min    Comment: (NOTE) Calculated using the CKD-EPI Creatinine Equation (2021)    Anion gap 14 5 - 15    Comment: Performed at [facility], [address], [place], [place] [address]  Lipase, blood     Status: None   Collection Time: 06/09/24  4:45 AM  Result Value Ref Range   Lipase 38 11 - 51 U/L    Comment: Performed at [facility], [address], [place], [place] [address]  Troponin T, High Sensitivity     Status: None   Collection Time: 06/09/24  4:45 AM  Result Value Ref Range   Troponin T High Sensitivity <15 0 - 19 ng/L    Comment: (NOTE) Biotin concentrations > 1000 ng/mL falsely decrease TnT results.  Serial cardiac troponin measurements are suggested.  Refer to the Links section for chest pain algorithms and additional  guidance. Performed at [facility], [address], [place], [place] [address]    US  Abdomen Limited RUQ (LIVER/GB) Result Date: 06/09/2024 CLINICAL DATA:  Right upper quadrant abdominal pain. EXAM: ULTRASOUND ABDOMEN LIMITED RIGHT UPPER QUADRANT COMPARISON:  CT scan earlier same day FINDINGS: Gallbladder: A single shadowing 2 cm stone is identified in the dependent gallbladder. This was not visible on CT imaging earlier today compatible with this stone being noncalcified. No gallbladder wall thickening. Sonographer reports a positive sonographic Murphy sign. No pericholecystic fluid. Common bile duct: Diameter: 5 mm Liver: Coarsening of liver echotexture  suggests component of underlying steatosis. Portal vein is patent on color Doppler imaging with normal direction of blood flow towards the liver. Other: None. IMPRESSION: 1. Cholelithiasis with positive sonographic Murphy sign. No gallbladder wall thickening or pericholecystic fluid. Imaging features raise concern for acute cholecystitis. 2. Coarsening of liver echotexture suggests component of underlying steatosis. Electronically Signed   By: Camellia Candle M.D.   On: 06/09/2024 07:33   CT ABDOMEN PELVIS W CONTRAST Result Date: 06/09/2024 CLINICAL DATA:  Nonlocalized abdominal pain. EXAM: CT ABDOMEN AND PELVIS WITH CONTRAST TECHNIQUE: Multidetector CT imaging of the abdomen and pelvis was performed using the standard protocol following bolus administration of intravenous contrast. RADIATION DOSE REDUCTION: This exam was performed according to the departmental dose-optimization program which includes automated exposure control, adjustment of the mA and/or kV according to patient size and/or use of iterative reconstruction technique. CONTRAST:  75mL OMNIPAQUE  IOHEXOL  350 MG/ML SOLN COMPARISON:  02/13/2015 FINDINGS: Lower chest: No acute findings. Hepatobiliary: No suspicious focal abnormality within the liver parenchyma. There is no evidence for gallstones, gallbladder wall thickening, or pericholecystic fluid. No intrahepatic or extrahepatic biliary dilation. Pancreas: No focal mass lesion. No dilatation of the main duct. No intraparenchymal cyst. No peripancreatic edema. Spleen: No splenomegaly. No suspicious focal mass lesion. Adrenals/Urinary Tract: No adrenal nodule or mass. Kidneys unremarkable. No evidence for hydroureter. The urinary bladder appears normal for the degree of distention. Stomach/Bowel: Stomach is unremarkable. No gastric wall thickening. No evidence of outlet obstruction. Duodenum is normally positioned as is the ligament of Treitz. No small bowel wall thickening. No small bowel dilatation.  The terminal ileum is normal. The appendix is normal. No gross colonic mass. No colonic wall thickening. Vascular/Lymphatic: There is mild atherosclerotic calcification of the abdominal aorta without aneurysm. There is no gastrohepatic or hepatoduodenal ligament lymphadenopathy. No retroperitoneal or mesenteric lymphadenopathy. No pelvic sidewall lymphadenopathy. Reproductive: Unremarkable. Other: No intraperitoneal free fluid. Musculoskeletal: No worrisome lytic or sclerotic osseous abnormality. IMPRESSION: 1. No acute findings  in the abdomen or pelvis. Specifically, no findings to explain the patient's history of abdominal pain. 2.  Aortic Atherosclerosis (ICD10-I70.0). Electronically Signed   By: Camellia Candle M.D.   On: 06/09/2024 06:27    Anti-infectives (From admission, onward)    None       Assessment/Plan Symptomatic Cholelithiasis, Acute Cholecystitis  - Afebrile. No tachycardia or hypotension.  - WBC wnl.  - Alk Phos 127, ALT 72, AST 31, T. BIli 0.4.  - Lipase wnl - CT A/P with no acute findings in the abdomen or pelvis  - RUQ US  w/ Cholelithiasis (2cm stone) with positive sonographic Murphy sign. No gallbladder wall thickening or pericholecystic fluid. - Continued MEG and RUQ ttp after pain medication - Suspect symptomatic cholelithiasis versus early acute cholecystitis.  Recommend IV antibiotics in OR for laparoscopic cholecystectomy.  I have explained the procedure, risks, and aftercare of Laparoscopic cholecystectomy.  Risks include but are not limited to anesthesia (MI, CVA, death, prolonged intubation and aspiration), bleeding, infection, wound problems, hernia, bile leak, injury to common bile duct/liver/intestine, possible need for subtotal cholecystectomy or open cholecystectomy, increased risk of DVT/PE and diarrhea post op.  She seems to understand and agrees to proceed. Keep NPO. Start IV abx. Possible d/c from PACU. If requires admission postoperatively, will plan admission  to observation.    FEN - NPO VTE - SCDs ID - Rocephin  Foley - None Dispo - To OR.   I reviewed nursing notes, last 24 h vitals and pain scores, last 48 h intake and output, last 24 h labs and trends, and last 24 h imaging results.   Ozell CHRISTELLA Shaper, [facility] Surgery 06/09/2024, 9:25 AM Please see Amion for pager number during day hours 7:00am-4:30pm     [1] No Known Allergies  "

## 2024-06-09 NOTE — ED Notes (Signed)
 PT desiring to hold off on more pain meds for this moment. Will use call bell if feeling like her pain is not controlled.

## 2024-06-09 NOTE — ED Triage Notes (Signed)
 Patient reports pain across her upper abdomen this evening , no emesis or diarrhea . Denies fever or chills , hypertensive at arrival .

## 2024-06-09 NOTE — ED Provider Notes (Signed)
 " [facility] AT [facility] Provider Note   CSN: [id number] Arrival date & time: 06/09/24  9642     History Chief Complaint  Patient presents with   Abdominal Pain   Hypertension    HPI Jane Cuevas is a 68 y.o. female presenting for  chief complaint of right upper quandrant pain. No surgical history. No history of similar. Pretty severe RUQ pain suddenly. Stayed NPO today to try to treat without improvement. No medications PTA   Patient's recorded medical, surgical, social, medication list and allergies were reviewed in the Snapshot window as part of the initial history.   Review of Systems   Review of Systems  Constitutional:  Negative for chills and fever.  HENT:  Negative for ear pain and sore throat.   Eyes:  Negative for pain and visual disturbance.  Respiratory:  Negative for cough and shortness of breath.   Cardiovascular:  Negative for chest pain and palpitations.  Gastrointestinal:  Positive for abdominal pain and nausea. Negative for vomiting.  Genitourinary:  Negative for dysuria and hematuria.  Musculoskeletal:  Negative for arthralgias and back pain.  Skin:  Negative for color change and rash.  Neurological:  Negative for seizures and syncope.  All other systems reviewed and are negative.   Physical Exam Updated Vital Signs BP (!) 174/88   Pulse 69   Temp 98.5 F (36.9 C)   Resp 14   SpO2 100%  Physical Exam Vitals and nursing note reviewed.  Constitutional:      General: She is not in acute distress.    Appearance: She is well-developed.  HENT:     Head: Normocephalic and atraumatic.  Eyes:     Conjunctiva/sclera: Conjunctivae normal.  Cardiovascular:     Rate and Rhythm: Normal rate and regular rhythm.     Heart sounds: No murmur heard. Pulmonary:     Effort: Pulmonary effort is normal. No respiratory distress.     Breath sounds: Normal breath sounds.  Abdominal:     Palpations: Abdomen is soft.      Tenderness: There is generalized abdominal tenderness and tenderness in the right upper quadrant, epigastric area and periumbilical area. There is guarding.  Musculoskeletal:        General: No swelling.     Cervical back: Neck supple.  Skin:    General: Skin is warm and dry.     Capillary Refill: Capillary refill takes less than 2 seconds.  Neurological:     Mental Status: She is alert.  Psychiatric:        Mood and Affect: Mood normal.      ED Course/ Medical Decision Making/ A&P    Procedures Procedures   Medications Ordered in ED Medications  fentaNYL  (SUBLIMAZE ) injection 50 mcg (50 mcg Intravenous Given 06/09/24 0547)  pantoprazole  (PROTONIX ) injection 40 mg (has no administration in time range)  famotidine  (PEPCID ) tablet 20 mg (has no administration in time range)  alum & mag hydroxide-simeth (MAALOX/MYLANTA) 200-200-20 MG/5ML suspension 30 mL (has no administration in time range)  ondansetron  (ZOFRAN ) injection 4 mg (4 mg Intravenous Given 06/09/24 0446)  lactated ringers  bolus 1,000 mL (0 mLs Intravenous Stopped 06/09/24 0616)  HYDROmorphone  (DILAUDID ) injection 1 mg (1 mg Intravenous Given 06/09/24 0624)  iohexol  (OMNIPAQUE ) 350 MG/ML injection 75 mL (75 mLs Intravenous Contrast Given 06/09/24 0600)   Medical Decision Making:   Jane Cuevas is a 69 y.o. female who presented to the ED today with abdominal pain, detailed above.  Patient placed on continuous vitals and telemetry monitoring while in ED which was reviewed periodically.  Complete initial physical exam performed, notably the patient  was HDS in NAD.     Reviewed and confirmed nursing documentation for past medical history, family history, social history.    Initial Assessment:   With the patient's presentation of abdominal pain, most likely diagnosis is nonspecific etiology/PUD. Other diagnoses were considered including (but not limited to) gastroenteritis, colitis, small bowel obstruction,  appendicitis, cholecystitis, pancreatitis, nephrolithiasis, UTI, pyleonephritis. These are considered less likely due to history of present illness and physical exam findings.   This is most consistent with an acute life/limb threatening illness complicated by underlying chronic conditions.   Initial Plan:  CBC/CMP to evaluate for underlying infectious/metabolic etiology for patient's abdominal pain  Lipase to evaluate for pancreatitis  EKG to evaluate for cardiac source of pain  CTAB/Pelvis with contrast to evaluate for structural/surgical etiology of patients' severe abdominal pain.  Urinalysis and repeat physical assessment to evaluate for UTI/Pyelonpehritis  Empiric management of symptoms with escalating pain control and antiemetics as needed.   Initial Study Results:   Laboratory  All laboratory results reviewed without evidence of clinically relevant pathology.   Exceptions include: Very mild transaminitis   EKG EKG was reviewed independently. Rate, rhythm, axis, intervals all examined and without medically relevant abnormality. ST segments without concerns for elevations.    Radiology All images reviewed independently. Agree with radiology report at this time.   CT ABDOMEN PELVIS W CONTRAST Result Date: 06/09/2024 CLINICAL DATA:  Nonlocalized abdominal pain. EXAM: CT ABDOMEN AND PELVIS WITH CONTRAST TECHNIQUE: Multidetector CT imaging of the abdomen and pelvis was performed using the standard protocol following bolus administration of intravenous contrast. RADIATION DOSE REDUCTION: This exam was performed according to the departmental dose-optimization program which includes automated exposure control, adjustment of the mA and/or kV according to patient size and/or use of iterative reconstruction technique. CONTRAST:  75mL OMNIPAQUE  IOHEXOL  350 MG/ML SOLN COMPARISON:  02/13/2015 FINDINGS: Lower chest: No acute findings. Hepatobiliary: No suspicious focal abnormality within the liver  parenchyma. There is no evidence for gallstones, gallbladder wall thickening, or pericholecystic fluid. No intrahepatic or extrahepatic biliary dilation. Pancreas: No focal mass lesion. No dilatation of the main duct. No intraparenchymal cyst. No peripancreatic edema. Spleen: No splenomegaly. No suspicious focal mass lesion. Adrenals/Urinary Tract: No adrenal nodule or mass. Kidneys unremarkable. No evidence for hydroureter. The urinary bladder appears normal for the degree of distention. Stomach/Bowel: Stomach is unremarkable. No gastric wall thickening. No evidence of outlet obstruction. Duodenum is normally positioned as is the ligament of Treitz. No small bowel wall thickening. No small bowel dilatation. The terminal ileum is normal. The appendix is normal. No gross colonic mass. No colonic wall thickening. Vascular/Lymphatic: There is mild atherosclerotic calcification of the abdominal aorta without aneurysm. There is no gastrohepatic or hepatoduodenal ligament lymphadenopathy. No retroperitoneal or mesenteric lymphadenopathy. No pelvic sidewall lymphadenopathy. Reproductive: Unremarkable. Other: No intraperitoneal free fluid. Musculoskeletal: No worrisome lytic or sclerotic osseous abnormality. IMPRESSION: 1. No acute findings in the abdomen or pelvis. Specifically, no findings to explain the patient's history of abdominal pain. 2.  Aortic Atherosclerosis (ICD10-I70.0). Electronically Signed   By: Camellia Candle M.D.   On: 06/09/2024 06:27    Final Reassessment and Plan:   CT scan nondiagnostic.  However patient's symptoms are still relatively profound.  She is required 3X doses of IV narcotic to get control of pain and she still has some residual discomfort.  It is  very focal to her right upper quadrant at this time. Will proceed to add on a right upper quadrant ultrasound given the mild transaminitis with a hepatobiliary pattern. Alternative diagnosis remains likely as peptic ulcer disease given her  longstanding history of heartburn symptoms, history including frequent consumption of food/citrus water and family history of peptic ulcer disease.  However want to fully rule out biliary disease given family history of cholecystitis/ascending cholangitis. Ultrasound ordered, further medications to treat gastritis ordered.  Oncoming team to follow-up ultrasound.  If negative then anticipate patient will be able to be discharged with antacid therapy and referral to GI for further workup.   Emergency Department Medication Summary:   Medications  fentaNYL  (SUBLIMAZE ) injection 50 mcg (50 mcg Intravenous Given 06/09/24 0547)  pantoprazole  (PROTONIX ) injection 40 mg (has no administration in time range)  famotidine  (PEPCID ) tablet 20 mg (has no administration in time range)  alum & mag hydroxide-simeth (MAALOX/MYLANTA) 200-200-20 MG/5ML suspension 30 mL (has no administration in time range)  ondansetron  (ZOFRAN ) injection 4 mg (4 mg Intravenous Given 06/09/24 0446)  lactated ringers  bolus 1,000 mL (0 mLs Intravenous Stopped 06/09/24 0616)  HYDROmorphone  (DILAUDID ) injection 1 mg (1 mg Intravenous Given 06/09/24 0624)  iohexol  (OMNIPAQUE ) 350 MG/ML injection 75 mL (75 mLs Intravenous Contrast Given 06/09/24 0600)            Clinical Impression:  1. Abdominal pain, unspecified abdominal location      Data Unavailable   Final Clinical Impression(s) / ED Diagnoses Final diagnoses:  Abdominal pain, unspecified abdominal location    Rx / DC Orders ED Discharge Orders     None         Jerral Meth, MD 06/09/24 [phone]  "

## 2024-06-09 NOTE — Anesthesia Preprocedure Evaluation (Addendum)
 "                                  Anesthesia Evaluation  Patient identified by MRN, date of birth, ID band Patient awake    Reviewed: Allergy & Precautions, NPO status , Patient's Chart, lab work & pertinent test results, reviewed documented beta blocker date and time   Airway Mallampati: II  TM Distance: >3 FB     Dental no notable dental hx. (+) Teeth Intact, Dental Advisory Given, Caps   Pulmonary neg pulmonary ROS   Pulmonary exam normal breath sounds clear to auscultation       Cardiovascular hypertension, Pt. on medications and Pt. on home beta blockers + CAD  Normal cardiovascular exam Rhythm:Regular Rate:Normal  EKG  06/09/24 NSR    Neuro/Psych negative neurological ROS  negative psych ROS   GI/Hepatic Neg liver ROS,,,Cholelithiasis symptomatic   Endo/Other  negative endocrine ROS    Renal/GU negative Renal ROS  Chemistry         Component                Value               Date/Time                 NA                       138                 06/09/2024 0445           NA                       143                 09/19/2019 0951           K                        3.9                 06/09/2024 0445           CL                       103                 06/09/2024 0445           CO2                      22                  06/09/2024 0445           BUN                      10                  06/09/2024 0445           BUN                      11                  09/19/2019 0951  CREATININE               0.80                06/09/2024 0445             Component                Value               Date/Time                 CALCIUM                  9.9                 06/09/2024 0445           ALKPHOS                  127 (H)             06/09/2024 0445           AST                      31                  06/09/2024 0445           ALT                      72 (H)              06/09/2024 0445           BILITOT                  0.4                  06/09/2024 0445           BILITOT                  0.4                 09/19/2019 0951         negative genitourinary   Musculoskeletal negative musculoskeletal ROS (+)    Abdominal   Peds  Hematology negative hematology ROS (+) Lab Results      Component                Value               Date                      WBC                      8.6                 06/09/2024                HGB                      14.4                06/09/2024                HCT                      42.6  06/09/2024                MCV                      90.8                06/09/2024                PLT                      243                 06/09/2024              Anesthesia Other Findings   Reproductive/Obstetrics                              Anesthesia Physical Anesthesia Plan  ASA: 2  Anesthesia Plan: General   Post-op Pain Management: Ofirmev  IV (intra-op)* and Dilaudid  IV   Induction: Intravenous and Cricoid pressure planned  PONV Risk Score and Plan: 3 and Midazolam , Dexamethasone  and Ondansetron   Airway Management Planned: Oral ETT  Additional Equipment: None  Intra-op Plan:   Post-operative Plan: Extubation in OR  Informed Consent: I have reviewed the patients History and Physical, chart, labs and discussed the procedure including the risks, benefits and alternatives for the proposed anesthesia with the patient or authorized representative who has indicated his/her understanding and acceptance.     Dental advisory given  Plan Discussed with: CRNA and Anesthesiologist  Anesthesia Plan Comments:          Anesthesia Quick Evaluation  "

## 2024-06-09 NOTE — Discharge Instructions (Addendum)
 [facility]  LAPAROSCOPIC SURGERY: POST OP INSTRUCTIONS Always review your discharge instruction sheet given to you by the facility where your surgery was performed. IF YOU HAVE DISABILITY OR FAMILY LEAVE FORMS, YOU MUST BRING THEM TO THE OFFICE FOR PROCESSING.   DO NOT GIVE THEM TO YOUR DOCTOR.  PAIN CONTROL  Pain regimen: take over-the-counter tylenol  (acetaminophen ) 1000mg  every six hours, the prescription ibuprofen  (600mg ) every six hours and the robaxin  (methocarbamol ) 750mg  every six hours. With all three of these, you should be taking something every two hours. Example: tylenol  (acetaminophen ) at 8am, ibuprofen  at 10am, robaxin  (methocarbamol ) at 12pm, tylenol  (acetaminophen ) again at 2pm, ibuprofen  again at 4pm, robaxin  (methocarbamol ) at 6pm. You also have a prescription for oxycodone , which should be taken if the tylenol  (acetaminophen ), ibuprofen , and robaxin  (methocarbamol ) are not enough to control your pain. You may take the oxycodone  as frequently as every four hours as needed, but if you are taking the other medications as above, you should not need the oxycodone  this frequently. You have also been given a prescription for Miralax  which is a stool softener. Please take this as prescribed because the oxycodone  can cause constipation and the Miralax  will minimize or prevent constipation. Do not drive while taking or under the influence of the oxycodone  as it is a narcotic medication. Use ice packs to help control pain.  If you need a refill on your pain medication, please contact your pharmacy.  They will contact our office to request authorization. Prescriptions will not be filled after 5pm or on week-ends.  HOME MEDICATIONS Take your usually prescribed medications unless otherwise directed.  DIET You should follow a light diet the first few days after arrival home.  Be sure to include lots of fluids daily.  Do not consume alcohol while taking oxycodone  or  ibuprofen .   CONSTIPATION It is common to experience some constipation after surgery and if you are taking pain medication. Constipation will make your abdominal pain worse, so it is best to try to prevent it by increasing fluid intake and taking a stool softener. You have already been given a prescription for a mild laxative, Miralax , which you should be taking once daily. You can increase the Miralax  to twice daily or even three times daily until you have a bowel movement. If still no bowel movement 24 hours after taking Miralax  three times in one day, you may try magnesium citrate, available over the counter at a local pharmacy.   WOUND/INCISION CARE Most patients will experience some swelling and bruising in the area of the incisions.  Ice packs will help.  Swelling and bruising can take several days to resolve.  May shower beginning 12/27.  Do not peel off or scrub skin glue. May allow warm soapy water to run over incision, then rinse and pat dry.  Do not soak in any water (tubs, hot tubs, pools, lakes, oceans) for one week.   ACTIVITIES You may resume regular (light) daily activities beginning the next day--such as daily self-care, walking, climbing stairs--gradually increasing activities as tolerated.  You may have sexual intercourse when it is comfortable.   No lifting greater than 5 pounds for six weeks.  You may drive when you are no longer taking narcotic pain medication, you can comfortably wear a seatbelt, and you can safely maneuver your car and apply brakes.  FOLLOW-UP You should see your doctor in the office for a follow-up appointment approximately 2-3 weeks after your surgery.  You should have  been given your post-op/follow-up appointment when your surgery was scheduled.  If you did not receive a post-op/follow-up appointment, make sure that you call for this appointment within a day or two after you arrive home to ensure a convenient appointment time.  WHEN TO CALL YOUR  DOCTOR: Fever over 101.5 Inability to urinate Continued bleeding from incision. Increased pain, redness, or drainage from the incision. Increasing abdominal pain  The clinic staff is available to answer your questions during regular business hours.  Please dont hesitate to call and ask to speak to one of the nurses for clinical concerns.  If you have a medical emergency, go to the nearest emergency room or call 911.  A surgeon from [facility] is always on call at the hospital. [address], [place], [place]  [address] ? [address] [address], [place], [place]   [address] [phone] ? [phone] ? FAX [phone] Web site: [email or web address]

## 2024-06-09 NOTE — Transfer of Care (Signed)
 Immediate Anesthesia Transfer of Care Note  Patient: Jane Cuevas  Procedure(s) Performed: LAPAROSCOPIC CHOLECYSTECTOMY  Patient Location: PACU  Anesthesia Type:General  Level of Consciousness: drowsy and patient cooperative  Airway & Oxygen Therapy: Patient Spontanous Breathing and Patient connected to nasal cannula oxygen  Post-op Assessment: Report given to RN, Post -op Vital signs reviewed and stable, and Patient moving all extremities X 4  Post vital signs: Reviewed and stable  Last Vitals:  Vitals Value Taken Time  BP 143/71 06/09/24 14:23  Temp    Pulse 69 06/09/24 14:25  Resp 19 06/09/24 14:25  SpO2 95 % 06/09/24 14:25  Vitals shown include unfiled device data.  Last Pain:  Vitals:   06/09/24 1259  TempSrc:   PainSc: 0-No pain      Patients Stated Pain Goal: 0 (06/09/24 1259)  Complications: No notable events documented.

## 2024-06-09 NOTE — ED Provider Notes (Signed)
 Sign out from Dr. Jerral.  68 year old female here with right upper quadrant pain sudden onset.  LFTs minimally elevated.  CT unremarkable.  Awaiting right upper quadrant ultrasound.  Disposition per results testing. Physical Exam  BP (!) 173/86   Pulse 65   Temp 98.5 F (36.9 C)   Resp 15   SpO2 100%   Physical Exam  Procedures  Procedures  ED Course / MDM    Medical Decision Making Amount and/or Complexity of Data Reviewed Labs: ordered. Radiology: ordered.  Risk OTC drugs. Prescription drug management. Decision regarding hospitalization.   8 AM.  Discussed with general surgery and they will consult on patient. PA Maczis.   9:45 AM.  General surgery will be taking patient to the OR.  They are not sure if she will be discharged from the PACU or admitted.  Antibiotics have been ordered by them.  Patient is updated.     Towana Ozell BROCKS, MD 06/09/24 [phone]

## 2024-06-09 NOTE — Anesthesia Procedure Notes (Signed)
 Procedure Name: Intubation Date/Time: 06/09/2024 1:35 PM  Performed by: Lamar Lucie DASEN, CRNAPre-anesthesia Checklist: Patient identified, Emergency Drugs available, Suction available and Patient being monitored Patient Re-evaluated:Patient Re-evaluated prior to induction Oxygen Delivery Method: Circle system utilized Preoxygenation: Pre-oxygenation with 100% oxygen Induction Type: IV induction Ventilation: Mask ventilation without difficulty Laryngoscope Size: Mac and 3 Grade View: Grade I Tube type: Oral Tube size: 7.0 mm Number of attempts: 1 Airway Equipment and Method: Stylet and Oral airway Placement Confirmation: ETT inserted through vocal cords under direct vision, positive ETCO2 and breath sounds checked- equal and bilateral Secured at: 21 cm Tube secured with: Tape Dental Injury: Teeth and Oropharynx as per pre-operative assessment

## 2024-06-10 NOTE — Anesthesia Postprocedure Evaluation (Signed)
"   Anesthesia Post Note  Patient: Jane Cuevas  Procedure(s) Performed: LAPAROSCOPIC CHOLECYSTECTOMY     Patient location during evaluation: PACU Anesthesia Type: General Level of consciousness: awake and alert Pain management: pain level controlled Vital Signs Assessment: post-procedure vital signs reviewed and stable Respiratory status: spontaneous breathing, nonlabored ventilation, respiratory function stable and patient connected to nasal cannula oxygen Cardiovascular status: blood pressure returned to baseline and stable Postop Assessment: no apparent nausea or vomiting Anesthetic complications: no   No notable events documented.  Last Vitals:  Vitals:   06/09/24 1445 06/09/24 1454  BP: (!) 160/75 (!) 150/77  Pulse: 69 66  Resp: 14 12  Temp:  37.1 C  SpO2: 93% 97%    Last Pain:  Vitals:   06/09/24 1424  TempSrc:   PainSc: Asleep   Pain Goal: Patients Stated Pain Goal: 0 (06/09/24 1259)                 Yancy Knoble L Makih Stefanko      "

## 2024-06-11 ENCOUNTER — Encounter (HOSPITAL_COMMUNITY): Payer: Self-pay | Admitting: Surgery

## 2024-06-12 LAB — SURGICAL PATHOLOGY
